# Patient Record
Sex: Female | Born: 1965 | Race: White | Hispanic: No | Marital: Single | Smoking: Current every day smoker
Health system: Southern US, Community
[De-identification: ages and names within clinical notes are randomized; demographics above are authoritative.]

## PROBLEM LIST (undated history)

## (undated) DIAGNOSIS — M1990 Unspecified osteoarthritis, unspecified site: Secondary | ICD-10-CM

## (undated) DIAGNOSIS — D649 Anemia, unspecified: Secondary | ICD-10-CM

## (undated) DIAGNOSIS — G43909 Migraine, unspecified, not intractable, without status migrainosus: Secondary | ICD-10-CM

## (undated) DIAGNOSIS — M549 Dorsalgia, unspecified: Secondary | ICD-10-CM

## (undated) DIAGNOSIS — Z8701 Personal history of pneumonia (recurrent): Secondary | ICD-10-CM

## (undated) HISTORY — DX: Migraine, unspecified, not intractable, without status migrainosus: G43.909

## (undated) HISTORY — DX: Personal history of pneumonia (recurrent): Z87.01

## (undated) HISTORY — PX: SHOULDER SURGERY: SHX246

## (undated) HISTORY — PX: KNEE SURGERY: SHX244

## (undated) HISTORY — DX: Unspecified osteoarthritis, unspecified site: M19.90

---

## 2014-08-03 ENCOUNTER — Encounter (HOSPITAL_COMMUNITY): Payer: Self-pay | Admitting: Emergency Medicine

## 2014-08-03 ENCOUNTER — Emergency Department (HOSPITAL_COMMUNITY): Payer: Self-pay

## 2014-08-03 DIAGNOSIS — R0789 Other chest pain: Secondary | ICD-10-CM | POA: Insufficient documentation

## 2014-08-03 DIAGNOSIS — R079 Chest pain, unspecified: Secondary | ICD-10-CM | POA: Insufficient documentation

## 2014-08-03 DIAGNOSIS — F17200 Nicotine dependence, unspecified, uncomplicated: Secondary | ICD-10-CM | POA: Insufficient documentation

## 2014-08-03 DIAGNOSIS — Z862 Personal history of diseases of the blood and blood-forming organs and certain disorders involving the immune mechanism: Secondary | ICD-10-CM | POA: Insufficient documentation

## 2014-08-03 DIAGNOSIS — Z79899 Other long term (current) drug therapy: Secondary | ICD-10-CM | POA: Insufficient documentation

## 2014-08-03 DIAGNOSIS — J159 Unspecified bacterial pneumonia: Secondary | ICD-10-CM | POA: Insufficient documentation

## 2014-08-03 LAB — I-STAT TROPONIN, ED: Troponin i, poc: 0 ng/mL (ref 0.00–0.08)

## 2014-08-03 NOTE — ED Notes (Signed)
Pt presents with Left side chest pain, non radiating, tremors, and nausea starting today 1400 today. Pt denies anything makes the pain better or worse. Pt states she was sitting at her desk at work when the pain started

## 2014-08-04 ENCOUNTER — Emergency Department (HOSPITAL_COMMUNITY)
Admission: EM | Admit: 2014-08-04 | Discharge: 2014-08-04 | Disposition: A | Discharge status: Home / Self Care | Payer: Self-pay | Attending: Emergency Medicine | Admitting: Emergency Medicine

## 2014-08-04 DIAGNOSIS — J189 Pneumonia, unspecified organism: Secondary | ICD-10-CM

## 2014-08-04 DIAGNOSIS — R0789 Other chest pain: Secondary | ICD-10-CM

## 2014-08-04 HISTORY — DX: Anemia, unspecified: D64.9

## 2014-08-04 HISTORY — DX: Dorsalgia, unspecified: M54.9

## 2014-08-04 LAB — BASIC METABOLIC PANEL
ANION GAP: 13 (ref 5–15)
BUN: 12 mg/dL (ref 6–23)
CALCIUM: 9.1 mg/dL (ref 8.4–10.5)
CO2: 23 mEq/L (ref 19–32)
Chloride: 103 mEq/L (ref 96–112)
Creatinine, Ser: 0.83 mg/dL (ref 0.50–1.10)
GFR calc non Af Amer: 83 mL/min — ABNORMAL LOW (ref >90)
Glucose, Bld: 104 mg/dL — ABNORMAL HIGH (ref 70–99)
Potassium: 4.6 mEq/L (ref 3.7–5.3)
SODIUM: 139 meq/L (ref 137–147)

## 2014-08-04 LAB — D-DIMER, QUANTITATIVE: D-Dimer, Quant: 0.27 ug/mL-FEU (ref 0.00–0.48)

## 2014-08-04 LAB — CBC
HCT: 38.6 % (ref 36.0–46.0)
Hemoglobin: 12.7 g/dL (ref 12.0–15.0)
MCH: 29.3 pg (ref 26.0–34.0)
MCHC: 32.9 g/dL (ref 30.0–36.0)
MCV: 89.1 fL (ref 78.0–100.0)
PLATELETS: 341 10*3/uL (ref 150–400)
RBC: 4.33 MIL/uL (ref 3.87–5.11)
RDW: 13.5 % (ref 11.5–15.5)
WBC: 14.8 10*3/uL — AB (ref 4.0–10.5)

## 2014-08-04 LAB — PRO B NATRIURETIC PEPTIDE: Pro B Natriuretic peptide (BNP): 90.8 pg/mL (ref 0–125)

## 2014-08-04 MED ORDER — LEVOFLOXACIN IN D5W 750 MG/150ML IV SOLN
750.0000 mg | Freq: Once | INTRAVENOUS | Status: AC
  Administered 2014-08-04: 750 mg via INTRAVENOUS
  Filled 2014-08-04: qty 150

## 2014-08-04 MED ORDER — BENZONATATE 100 MG PO CAPS: 100.0000 mg | ORAL_CAPSULE | Freq: Three times a day (TID) | ORAL | Status: DC

## 2014-08-04 MED ORDER — IBUPROFEN 600 MG PO TABS: 600.0000 mg | ORAL_TABLET | Freq: Four times a day (QID) | ORAL | Status: DC | PRN

## 2014-08-04 MED ORDER — LEVOFLOXACIN 750 MG PO TABS: 750.0000 mg | ORAL_TABLET | Freq: Every day | ORAL | Status: DC

## 2014-08-04 NOTE — ED Provider Notes (Signed)
CSN: [id number]     Arrival date & time 08/03/14  2244 History   First MD Initiated Contact with Patient 08/04/14 0016     Chief Complaint  Patient presents with  . Chest Pain     (Consider location/radiation/quality/duration/timing/severity/associated sxs/prior Treatment) HPI Patient presents with left-sided chest spasm starting today around 1400. The spasms last roughly 2-5 seconds. Is associated with shortness of breath. She's had no lower extremity swelling or pain. Patient with mild cough. She's had no fever chills. Patient had several episodes of spasms during the interview. She has an extensive family history of thromboembolic disease. No recent travel or surgeries. Patient smokes half pack to three quarters of a pack of cigarettes daily. Past Medical History  Diagnosis Date  . Anemia   . Back pain    Past Surgical History  Procedure Laterality Date  . Shoulder surgery     History reviewed. No pertinent family history. History  Substance Use Topics  . Smoking status: Current Every Day Smoker -- 0.50 packs/day    Types: Cigarettes  . Smokeless tobacco: Not on file  . Alcohol Use: Yes     Comment: social   OB History   Grav Para Term Preterm Abortions TAB SAB Ect Mult Living                 Review of Systems  Constitutional: Negative for fever and chills.  Respiratory: Positive for cough and shortness of breath.   Cardiovascular: Positive for chest pain. Negative for palpitations and leg swelling.  Gastrointestinal: Negative for nausea, vomiting, abdominal pain and diarrhea.  Musculoskeletal: Negative for back pain and neck pain.  Skin: Negative for rash and wound.  Neurological: Negative for dizziness, weakness, light-headedness and headaches.      Allergies  Review of patient's allergies indicates no known allergies.  Home Medications   Prior to Admission medications   Medication Sig Start Date End Date Taking? Authorizing Provider  acetaminophen (TYLENOL)  500 MG tablet Take 500-1,000 mg by mouth every 6 (six) hours as needed for moderate pain.   Yes Historical Provider, MD   BP 157/82  Pulse 76  Temp(Src) 97.9 F (36.6 C) (Oral)  Resp 16  Ht 5\' 3"  (1.6 m)  Wt 213 lb (96.616 kg)  BMI 37.74 kg/m2  SpO2 98%  LMP 07/01/2014 Physical Exam  ED Course  Procedures (including critical care time) Labs Review Labs Reviewed  CBC - Abnormal; Notable for the following:    WBC 14.8 (*)    All other components within normal limits  BASIC METABOLIC PANEL - Abnormal; Notable for the following:    Glucose, Bld 104 (*)    GFR calc non Af Amer 83 (*)    All other components within normal limits  D-DIMER, QUANTITATIVE  PRO B NATRIURETIC PEPTIDE  I-STAT TROPOININ, ED    Imaging Review Dg Chest 2 View  08/04/2014   CLINICAL DATA:  Chest pain and shortness of breath. History of smoking.  EXAM: CHEST  2 VIEW  COMPARISON:  None.  FINDINGS: The lungs are well expanded. Mild bibasilar airspace opacities may reflect mild pneumonia. No pleural effusion or pneumothorax is seen.  The heart is borderline normal in size. No acute osseous abnormalities are identified.  IMPRESSION: Mild bibasilar airspace opacities raise question for mild pneumonia.   Electronically Signed   By: Roanna Raider M.D.   On: 08/04/2014 00:37     EKG Interpretation   Date/Time:  Friday August 03 2014 22:54:05 EDT Ventricular  Rate:  72 PR Interval:  134 QRS Duration: 86 QT Interval:  420 QTC Calculation: 459 R Axis:   17 Text Interpretation:  Normal sinus rhythm Normal ECG Confirmed by  Alvia Tory  MD, Amala Petion ([id number]) on 08/04/2014 12:17:19 AM      MDM   Final diagnoses:  None    Patient with atypical chest pain for coronary artery disease. Troponin EKG without any abnormality. She has a normal d-dimer. Chest x-ray concerning for mild pneumoniain the setting of elevated white blood cells. Given first dose of antibiotic in emergency department and discharged home with  prescription. Return precautions have been given.    Loren Raceravid Etta Gassett, MD 08/04/14 [phone]

## 2014-08-04 NOTE — Discharge Instructions (Signed)

## 2014-09-29 NOTE — ED Provider Notes (Signed)
08/03/14  Physical Exam  Constitutional: She is oriented to person, place, and time. She appears well-developed and well-nourished. No distress.  HENT:  Head: Normocephalic and atraumatic.  Nose: Nose normal.  Mouth/Throat: Oropharynx is clear and moist. No oropharyngeal exudate.  Eyes: Conjunctivae and EOM are normal. Pupils are equal, round, and reactive to light.  Neck: Normal range of motion. Neck supple.  Cardiovascular: Normal rate, regular rhythm, normal heart sounds and intact distal pulses.  Exam reveals no gallop and no friction rub.   No murmur heard. Pulmonary/Chest: Effort normal. No respiratory distress. She has no wheezes.  Few scattered rhonchi  Abdominal: Soft. Bowel sounds are normal. She exhibits no distension and no mass. There is no tenderness. There is no rebound and no guarding. No hernia.  Musculoskeletal: Normal range of motion. She exhibits no edema and no tenderness.  Neurological: She is alert and oriented to person, place, and time.  Skin: Skin is warm and dry. No rash noted. She is not diaphoretic. No erythema. No pallor.  Psychiatric: She has a normal mood and affect. Her behavior is normal.      Loren Raceravid Deveron Shamoon, MD 09/29/14 [phone]

## 2014-11-27 ENCOUNTER — Ambulatory Visit (INDEPENDENT_AMBULATORY_CARE_PROVIDER_SITE_OTHER): Payer: BC Managed Care – PPO | Admitting: Internal Medicine

## 2014-11-27 ENCOUNTER — Encounter: Payer: Self-pay | Admitting: Internal Medicine

## 2014-11-27 VITALS — BP 134/86 | Temp 97.9°F | Ht 62.25 in | Wt 231.3 lb

## 2014-11-27 DIAGNOSIS — Z418 Encounter for other procedures for purposes other than remedying health state: Secondary | ICD-10-CM

## 2014-11-27 DIAGNOSIS — Z833 Family history of diabetes mellitus: Secondary | ICD-10-CM

## 2014-11-27 DIAGNOSIS — Z720 Tobacco use: Secondary | ICD-10-CM

## 2014-11-27 DIAGNOSIS — M5430 Sciatica, unspecified side: Secondary | ICD-10-CM | POA: Insufficient documentation

## 2014-11-27 DIAGNOSIS — R51 Headache: Secondary | ICD-10-CM

## 2014-11-27 DIAGNOSIS — R519 Headache, unspecified: Secondary | ICD-10-CM | POA: Insufficient documentation

## 2014-11-27 DIAGNOSIS — Z23 Encounter for immunization: Secondary | ICD-10-CM

## 2014-11-27 DIAGNOSIS — Z299 Encounter for prophylactic measures, unspecified: Secondary | ICD-10-CM

## 2014-11-27 DIAGNOSIS — Z8249 Family history of ischemic heart disease and other diseases of the circulatory system: Secondary | ICD-10-CM

## 2014-11-27 DIAGNOSIS — M5431 Sciatica, right side: Secondary | ICD-10-CM

## 2014-11-27 DIAGNOSIS — F17200 Nicotine dependence, unspecified, uncomplicated: Secondary | ICD-10-CM | POA: Insufficient documentation

## 2014-11-27 LAB — CBC WITH DIFFERENTIAL/PLATELET
Basophils Absolute: 0.1 10*3/uL (ref 0.0–0.1)
Basophils Relative: 0.6 % (ref 0.0–3.0)
EOS ABS: 0.2 10*3/uL (ref 0.0–0.7)
Eosinophils Relative: 1.8 % (ref 0.0–5.0)
HCT: 37.1 % (ref 36.0–46.0)
Hemoglobin: 12.3 g/dL (ref 12.0–15.0)
Lymphocytes Relative: 27.6 % (ref 12.0–46.0)
Lymphs Abs: 3.7 10*3/uL (ref 0.7–4.0)
MCHC: 33 g/dL (ref 30.0–36.0)
MCV: 85.7 fl (ref 78.0–100.0)
MONO ABS: 0.7 10*3/uL (ref 0.1–1.0)
Monocytes Relative: 5.1 % (ref 3.0–12.0)
NEUTROS PCT: 64.9 % (ref 43.0–77.0)
Neutro Abs: 8.6 10*3/uL — ABNORMAL HIGH (ref 1.4–7.7)
Platelets: 326 10*3/uL (ref 150.0–400.0)
RBC: 4.33 Mil/uL (ref 3.87–5.11)
RDW: 13.7 % (ref 11.5–15.5)
WBC: 13.2 10*3/uL — ABNORMAL HIGH (ref 4.0–10.5)

## 2014-11-27 LAB — TSH: TSH: 4.61 u[IU]/mL — ABNORMAL HIGH (ref 0.35–4.50)

## 2014-11-27 LAB — HEMOGLOBIN A1C: Hgb A1c MFr Bld: 6.1 % (ref 4.6–6.5)

## 2014-11-27 NOTE — Progress Notes (Signed)
Pre visit review using our clinic review tool, if applicable. No additional management support is needed unless otherwise documented below in the visit note.   Chief Complaint  Patient presents with  . Establish Care    HPI: Patient  Susan Peters  48 y.o. comes in today for new patient  Health Care visit . She needs a primary care physician. She is originally from [place] but been in the area  Since 1986 .Marland Kitchen  Went to [employer].   College  She's recently seen Dr Henderson Cloud  For gyne and  Nl pap  And planned to sonohysterogram to check for polyps of fibroids .   Sciatica  ongoing problem ever since she was younger Back issues since  Senior in HS .    ocass  Back ache    Has of flaring   Last year and both legs stopped working at that time  rx per chiropractor.    History of migraines takes medicines 3-4 times a month at the most does get frequent headaches. No sleep apnea. Has been immunized against varicella.  Health Maintenance  Topic Date Due  . INFLUENZA VACCINE  07/29/2015  . PAP SMEAR  11/15/2017  . TETANUS/TDAP  11/27/2024   Health Maintenance Review LIFESTYLE:  Exercise:   Walks a lot and lots of activity but no standard  Tobacco/ETS: less than pack per day . Onset  19  Pre 13   Stopped 4 years .   Alcohol: once in a blu moon  Sugar beverages: 3-4 per day  2-4 c coffee per day. And 3-4 sodas pepsi or md .    Sleep: 7-8  Drug use: no PAP: utd  MAMMO: 8 15   ROS:   chiro said had arthritsi back and hips  ses as needed  GEN/ HEENT: No fever, significant weight changes sweats headaches vision problems hearing changes, has migraines  fam hx of same  Some has .  ocass meds. Tylenol or advil aleve as needed .  3-4 x per month.  CV/ PULM; No chest pain shortness of breath cough, syncope,edema  change in exercise tolerance. GI /GU: No adominal pain, vomiting, change in bowel habits. No blood in the stool. No significant GU symptoms. History of proctitis blood in the stool when she was  much younger treated with change in diet avoiding red meat no recent recurrences. SKIN/HEME: ,no acute skin rashes suspicious lesions or bleeding. No lymphadenopathy, nodules, masses.  NEURO/ PSYCH:  No neurologic signs such as weakness numbness see back pain. No depression anxiety. IMM/ Allergy: No unusual infections.  Allergy .   REST of 12 system review negative except as per HPI   Past Medical History  Diagnosis Date  . Anemia   . Back pain   . Migraine     long standing 3-4 x per month takes med  . Arthritis   . Hx of bacterial pneumonia     Past Surgical History  Procedure Laterality Date  . Shoulder surgery Left     1981  . Knee surgery Left     1982    Family History  Problem Relation Age of Onset  . Arthritis Mother   . Cancer Mother     Uterus  . Hyperlipidemia Mother   . Stroke Mother   . Heart disease Mother   . Hypertension Mother   . Diabetes Mother   . Hyperlipidemia Father   . Heart disease Father   . Hypertension Father   . Early  death Father   . Cancer Maternal Aunt   . Hypertension Maternal Aunt   . Diabetes Maternal Aunt   . Heart disease Paternal Uncle   . Arthritis Maternal Grandmother   . Stroke Maternal Grandmother   . Hypertension Maternal Grandmother   . Arthritis Maternal Grandfather   . Heart disease Maternal Grandfather   . Hypertension Maternal Grandfather   . Arthritis Paternal Grandmother   . Heart disease Paternal Grandmother   . Hypertension Paternal Grandmother   . Diabetes Paternal Grandmother   . Arthritis Paternal Grandfather   . Heart disease Paternal Grandfather   . Stroke Paternal Grandfather   . Hypertension Paternal Grandfather   . Kidney disease Paternal Grandfather   . Cancer Maternal Aunt     Lung  . Diabetes Maternal Aunt   . Diabetes Cousin   maternal  Side diabetes  Moms and aunts  Sister is hypoglycemia  Dad mi  6745   Intrinsic heart disease  /cardiomyopathy .  History   Social History  . Marital  Status: Single    Spouse Name: N/A    Number of Children: N/A  . Years of Education: N/A   Social History Main Topics  . Smoking status: Current Every Day Smoker -- 0.50 packs/day    Types: Cigarettes  . Smokeless tobacco: Never Used  . Alcohol Use: 0.0 oz/week    0 Not specified per week     Comment: social  . Drug Use: No  . Sexual Activity: None   Other Topics Concern  . 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 [DISCONTINUED] acetaminophen (TYLENOL) 500 MG tablet Take 500-1,000 mg by mouth every 6 (six) hours as needed for moderate pain.  . [DISCONTINUED] benzonatate (TESSALON) 100 MG capsule Take 1 capsule (100 mg total) by mouth every 8 (eight) hours.  . [DISCONTINUED] ibuprofen (ADVIL,MOTRIN) 600 MG tablet Take 1 tablet (600 mg total) by mouth every 6 (six) hours as needed.  . [DISCONTINUED] levofloxacin (LEVAQUIN) 750 MG tablet Take 1 tablet (750 mg total) by mouth daily. X 7 days    EXAM:  BP 134/86 mmHg  Temp(Src) 97.9 F (36.6 C) (Oral)  Ht 5' 2.25" (1.581 m)  Wt 231 lb 4.8 oz (104.917 kg)  BMI 41.97 kg/m2  LMP 11/25/2014  Body mass index is 41.97 kg/(m^2).  Physical Exam: Vital signs reviewed UUV:[id number]:This is a well-developed well-nourished alert cooperative    who appearsr stated age in no acute distress. Walks someone limping  HEENT: normocephalic atraumatic , Eyes: PERRL EOM's full, conjunctiva clear, Nares: paten,t no deformity discharge or tenderness., Ears: no deformity EAC's clear TMs with normal landmarks. Mouth: clear OP, no lesions, edema.  Moist mucous membranes. Dentition in adequate repair. NECK: supple without masses, thyromegaly or  bruits. CHEST/PULM:  Clear to auscultation and percussion breath sounds equal no wheeze , rales or rhonchi.  CV: PMI is nondisplaced, S1 S2 no gallops, murmurs, rubs. Peripheral pulses are full without delay.No JVD .  ABDOMEN: Bowel sounds normal nontender  No guard or rebound, no hepato splenomegal no CVA tenderness.  Extremtities:  No clubbing cyanosis or edema, no acute joint swelling or redness no focal atrophy NEURO:  Oriented x3, cranial nerves 3-12 appear to be intact, no obvious focal weakness,gait  Slightly  antalgic  SKIN: No acute rashes normal turgor, color, no bruising or petechiae. PSYCH: Oriented, good eye contact, no obvious depression anxiety, cognition and judgment appear normal. LN: no cervical l adenopathy    ASSESSMENT AND PLAN:  Discussed the following assessment and plan:  Sciatica, right - managing sees chiro  Frequent headaches - lsi tracking  - Plan: Basic metabolic panel, CBC with Differential, Hepatic function panel, Lipid panel, TSH, Hemoglobin A1c, CANCELED: POCT urinalysis dipstick  Family history of heart disease - Plan: Basic metabolic panel, CBC with Differential, Hepatic function panel, Lipid panel, TSH, Hemoglobin A1c, CANCELED: POCT urinalysis dipstick  Tobacco use disorder - encouraged to stop  rx if needed   Family history of diabetes mellitus (DM) - dsic change diet and beverages  labs today - Plan: Basic metabolic panel, CBC with Differential, Hepatic function panel, Lipid panel, TSH, Hemoglobin A1c, CANCELED: POCT urinalysis dipstick  Preventive measure - Plan: Basic metabolic panel, CBC with Differential, Hepatic function panel, Lipid panel, TSH, Hemoglobin A1c, CANCELED: POCT urinalysis dipstick  Need for prophylactic vaccination and inoculation against influenza - Plan: Flu Vaccine QUAD 36+ mos PF IM (Fluarix Quad PF)  Need for Tdap vaccination - Plan: Tdap vaccine greater than or equal to 7yo IM  Patient Care Team: Madelin HeadingsWanda K Panosh, MD as  PCP - General (Internal Medicine) Leslie AndreaJames E Tomblin II, MD as Consulting Physician (Obstetrics and Gynecology) Patient Instructions   Decrease limit any sugar beverages  To avoid getting diabetes   You have a strong family hx .   Stopping tobacco is the best heart healthy prevention you can do .   Will notify you  of labs when available.  And then plan follow up  Advise tracking  Calories beverages and exercise and sleep . Some weight loss may help your back and avoid diabetes .  Can  Do a nurtition referral if appropriate    Healthy lifestyle includes : At least 150 minutes of exercise weeks  , weight at healthy levels, which is usually   BMI 19-25. Avoid trans fats and processed foods;  Increase fresh fruits and veges to 5 servings per day. And avoid sweet beverages including tea and juice. Mediterranean diet with olive oil and nuts have been noted to be heart and brain healthy . Avoid tobacco products . Limit  alcohol to  7 per week for women and 14 servings for men.  Get adequate sleep . Wear seat belts . Don't text and drive .      Why follow it? Research shows. . Those who follow the Mediterranean diet have a reduced risk of heart disease  . The diet is associated with a reduced incidence of Parkinson's and Alzheimer's diseases . People following the diet may have longer life expectancies and lower rates of chronic diseases  . The Dietary Guidelines for Americans recommends the Mediterranean diet as an eating plan to promote health and prevent disease  What Is the Mediterranean Diet?  . Healthy eating plan based on typical foods and recipes of Mediterranean-style cooking . The diet is primarily a plant based diet; these foods should make up a majority of meals   Starches - Plant based foods should make up a majority of meals - They are an important sources of vitamins, minerals, energy, antioxidants, and  fiber - Choose whole grains, foods high in fiber and minimally processed  items  - Typical grain sources include wheat, oats, barley, corn, brown rice, bulgar, farro, millet, polenta, couscous  - Various types of beans include chickpeas, lentils, fava beans, black beans, white beans   Fruits  Veggies - Large quantities of antioxidant rich fruits & veggies; 6 or more servings  - Vegetables can be eaten raw or lightly drizzled with oil and cooked  - Vegetables common to the traditional Mediterranean Diet include: artichokes, arugula, beets, broccoli, brussel sprouts, cabbage, carrots, celery, collard greens, cucumbers, eggplant, kale, leeks, lemons, lettuce, mushrooms, okra, onions, peas, peppers, potatoes, pumpkin, radishes, rutabaga, shallots, spinach, sweet potatoes, turnips, zucchini - Fruits common to the Mediterranean Diet include: apples, apricots, avocados, cherries, clementines, dates, figs, grapefruits, grapes, melons, nectarines, oranges, peaches, pears, pomegranates, strawberries, tangerines  Fats - Replace butter and margarine with healthy oils, such as olive oil, canola oil, and tahini  - Limit nuts to no more than a handful a day  - Nuts include walnuts, almonds, pecans, pistachios, pine nuts  - Limit or avoid candied, honey roasted or heavily salted nuts - Olives are central to the [employer] - can be eaten whole or used in a variety of dishes   Meats Protein - Limiting red meat: no more than a few times a month - When eating red meat: choose lean cuts and keep the portion to the size of deck of cards - Eggs: approx. 0 to 4 times a week  - Fish and lean poultry: at least 2 a week  - Healthy protein sources include, chicken, [place], lean beef, lamb - Increase intake of seafood such as tuna, salmon, trout, mackerel, shrimp, scallops - Avoid or limit high fat processed meats such as sausage and bacon  Dairy - Include moderate amounts of low fat dairy products  - Focus on healthy dairy such as fat free yogurt, skim milk, low or reduced fat cheese -  Limit dairy products higher in fat such as whole or 2% milk, cheese, ice cream  Alcohol - Moderate amounts of red wine is ok  - No more than 5 oz daily for women (all ages) and men older than age 28  - No more than 10 oz of wine daily for men younger than 67  Other - Limit sweets and other desserts  - Use herbs and spices instead of salt to flavor foods  - Herbs and spices common to the traditional Mediterranean Diet include: basil, bay leaves, chives, cloves, cumin, fennel, garlic, lavender, marjoram, mint, oregano, parsley, pepper, rosemary, sage, savory, sumac, tarragon, thyme   It's not just a diet, it's a lifestyle:  . The Mediterranean diet includes lifestyle factors typical of those in the region  . Foods, drinks and meals are best eaten with others and savored . Daily physical activity is important for overall good health . This could be strenuous exercise like running and aerobics . This could also be more leisurely activities such as walking, housework, yard-work, or taking the stairs . Moderation is the key; a balanced and healthy diet accommodates most foods and drinks . Consider portion sizes and frequency of consumption of certain foods   Meal Ideas & Options:  . Breakfast:  o Whole wheat toast or whole wheat English muffins with peanut butter & hard boiled egg o Steel cut oats topped with apples & cinnamon and skim milk  o Fresh fruit: banana, strawberries, melon, berries, peaches  o Smoothies:  strawberries, bananas, greek yogurt, peanut butter o Low fat greek yogurt with blueberries and granola  o Egg white omelet with spinach and mushrooms o Breakfast couscous: whole wheat couscous, apricots, skim milk, cranberries  . Sandwiches:  o Hummus and grilled vegetables (peppers, zucchini, squash) on whole wheat bread   o Grilled chicken on whole wheat pita with lettuce, tomatoes, cucumbers or tzatziki  o Tuna salad on whole wheat bread: tuna salad made with greek yogurt,  olives, red peppers, capers, green onions o Garlic rosemary lamb pita: lamb sauted with garlic, rosemary, salt & pepper; add lettuce, cucumber, greek yogurt to pita - flavor with lemon juice and black pepper  . Seafood:  o Mediterranean grilled salmon, seasoned with garlic, basil, parsley, lemon juice and black pepper o Shrimp, lemon, and spinach whole-grain pasta salad made with low fat greek yogurt  o Seared scallops with lemon orzo  o Seared tuna steaks seasoned salt, pepper, coriander topped with tomato mixture of olives, tomatoes, olive oil, minced garlic, parsley, green onions and cappers  . Meats:  o Herbed greek chicken salad with kalamata olives, cucumber, feta  o Red bell peppers stuffed with spinach, bulgur, lean ground beef (or lentils) & topped with feta   o Kebabs: skewers of chicken, tomatoes, onions, zucchini, squash  o [place] burgers: made with red onions, mint, dill, lemon juice, feta cheese topped with roasted red peppers . Vegetarian o Cucumber salad: cucumbers, artichoke hearts, celery, red onion, feta cheese, tossed in olive oil & lemon juice  o Hummus and whole grain pita points with a greek salad (lettuce, tomato, feta, olives, cucumbers, red onion) o Lentil soup with celery, carrots made with vegetable broth, garlic, salt and pepper  o Tabouli salad: parsley, bulgur, mint, scallions, cucumbers, tomato, radishes, lemon juice, olive oil, salt and pepper.           Neta Mends. Panosh M.D.

## 2014-11-27 NOTE — Patient Instructions (Addendum)
Decrease limit any sugar beverages  To avoid getting diabetes   You have a strong family hx .   Stopping tobacco is the best heart healthy prevention you can do .   Will notify you  of labs when available.  And then plan follow up  Advise tracking  Calories beverages and exercise and sleep . Some weight loss may help your back and avoid diabetes .  Can  Do a nurtition referral if appropriate    Healthy lifestyle includes : At least 150 minutes of exercise weeks  , weight at healthy levels, which is usually   BMI 19-25. Avoid trans fats and processed foods;  Increase fresh fruits and veges to 5 servings per day. And avoid sweet beverages including tea and juice. Mediterranean diet with olive oil and nuts have been noted to be heart and brain healthy . Avoid tobacco products . Limit  alcohol to  7 per week for women and 14 servings for men.  Get adequate sleep . Wear seat belts . Don't text and drive .      Why follow it? Research shows. . Those who follow the Mediterranean diet have a reduced risk of heart disease  . The diet is associated with a reduced incidence of Parkinson's and Alzheimer's diseases . People following the diet may have longer life expectancies and lower rates of chronic diseases  . The Dietary Guidelines for Americans recommends the Mediterranean diet as an eating plan to promote health and prevent disease  What Is the Mediterranean Diet?  . Healthy eating plan based on typical foods and recipes of Mediterranean-style cooking . The diet is primarily a plant based diet; these foods should make up a majority of meals   Starches - Plant based foods should make up a majority of meals - They are an important sources of vitamins, minerals, energy, antioxidants, and fiber - Choose whole grains, foods high in fiber and minimally processed items  - Typical grain sources include wheat, oats, barley, corn, brown rice, bulgar, farro, millet, polenta, couscous  - Various  types of beans include chickpeas, lentils, fava beans, black beans, white beans   Fruits  Veggies - Large quantities of antioxidant rich fruits & veggies; 6 or more servings  - Vegetables can be eaten raw or lightly drizzled with oil and cooked  - Vegetables common to the traditional Mediterranean Diet include: artichokes, arugula, beets, broccoli, brussel sprouts, cabbage, carrots, celery, collard greens, cucumbers, eggplant, kale, leeks, lemons, lettuce, mushrooms, okra, onions, peas, peppers, potatoes, pumpkin, radishes, rutabaga, shallots, spinach, sweet potatoes, turnips, zucchini - Fruits common to the Mediterranean Diet include: apples, apricots, avocados, cherries, clementines, dates, figs, grapefruits, grapes, melons, nectarines, oranges, peaches, pears, pomegranates, strawberries, tangerines  Fats - Replace butter and margarine with healthy oils, such as olive oil, canola oil, and tahini  - Limit nuts to no more than a handful a day  - Nuts include walnuts, almonds, pecans, pistachios, pine nuts  - Limit or avoid candied, honey roasted or heavily salted nuts - Olives are central to the [employer] diet - can be eaten whole or used in a variety of dishes   Meats Protein - Limiting red meat: no more than a few times a month - When eating red meat: choose lean cuts and keep the portion to the size of deck of cards - Eggs: approx. 0 to 4 times a week  - Fish and lean poultry: at least 2 a week  - Healthy protein sources include,  chicken, [place], lean beef, lamb - Increase intake of seafood such as tuna, salmon, trout, mackerel, shrimp, scallops - Avoid or limit high fat processed meats such as sausage and bacon  Dairy - Include moderate amounts of low fat dairy products  - Focus on healthy dairy such as fat free yogurt, skim milk, low or reduced fat cheese - Limit dairy products higher in fat such as whole or 2% milk, cheese, ice cream  Alcohol - Moderate amounts of red wine is ok  - No  more than 5 oz daily for women (all ages) and men older than age 34  - No more than 10 oz of wine daily for men younger than 59  Other - Limit sweets and other desserts  - Use herbs and spices instead of salt to flavor foods  - Herbs and spices common to the traditional Mediterranean Diet include: basil, bay leaves, chives, cloves, cumin, fennel, garlic, lavender, marjoram, mint, oregano, parsley, pepper, rosemary, sage, savory, sumac, tarragon, thyme   It's not just a diet, it's a lifestyle:  . The Mediterranean diet includes lifestyle factors typical of those in the region  . Foods, drinks and meals are best eaten with others and savored . Daily physical activity is important for overall good health . This could be strenuous exercise like running and aerobics . This could also be more leisurely activities such as walking, housework, yard-work, or taking the stairs . Moderation is the key; a balanced and healthy diet accommodates most foods and drinks . Consider portion sizes and frequency of consumption of certain foods   Meal Ideas & Options:  . Breakfast:  o Whole wheat toast or whole wheat English muffins with peanut butter & hard boiled egg o Steel cut oats topped with apples & cinnamon and skim milk  o Fresh fruit: banana, strawberries, melon, berries, peaches  o Smoothies: strawberries, bananas, greek yogurt, peanut butter o Low fat greek yogurt with blueberries and granola  o Egg white omelet with spinach and mushrooms o Breakfast couscous: whole wheat couscous, apricots, skim milk, cranberries  . Sandwiches:  o Hummus and grilled vegetables (peppers, zucchini, squash) on whole wheat bread   o Grilled chicken on whole wheat pita with lettuce, tomatoes, cucumbers or tzatziki  o Tuna salad on whole wheat bread: tuna salad made with greek yogurt, olives, red peppers, capers, green onions o Garlic rosemary lamb pita: lamb sauted with garlic, rosemary, salt & pepper; add lettuce,  cucumber, greek yogurt to pita - flavor with lemon juice and black pepper  . Seafood:  o Mediterranean grilled salmon, seasoned with garlic, basil, parsley, lemon juice and black pepper o Shrimp, lemon, and spinach whole-grain pasta salad made with low fat greek yogurt  o Seared scallops with lemon orzo  o Seared tuna steaks seasoned salt, pepper, coriander topped with tomato mixture of olives, tomatoes, olive oil, minced garlic, parsley, green onions and cappers  . Meats:  o Herbed greek chicken salad with kalamata olives, cucumber, feta  o Red bell peppers stuffed with spinach, bulgur, lean ground beef (or lentils) & topped with feta   o Kebabs: skewers of chicken, tomatoes, onions, zucchini, squash  o [place] burgers: made with red onions, mint, dill, lemon juice, feta cheese topped with roasted red peppers . Vegetarian o Cucumber salad: cucumbers, artichoke hearts, celery, red onion, feta cheese, tossed in olive oil & lemon juice  o Hummus and whole grain pita points with a greek salad (lettuce, tomato, feta, olives, cucumbers, red  onion) o Lentil soup with celery, carrots made with vegetable broth, garlic, salt and pepper  o Tabouli salad: parsley, bulgur, mint, scallions, cucumbers, tomato, radishes, lemon juice, olive oil, salt and pepper.

## 2014-11-28 LAB — BASIC METABOLIC PANEL
BUN: 11 mg/dL (ref 6–23)
CALCIUM: 8.7 mg/dL (ref 8.4–10.5)
CO2: 22 meq/L (ref 19–32)
CREATININE: 0.6 mg/dL (ref 0.4–1.2)
Chloride: 107 mEq/L (ref 96–112)
GFR: 115.65 mL/min (ref >60.00)
Glucose, Bld: 74 mg/dL (ref 70–99)
Potassium: 3.6 mEq/L (ref 3.5–5.1)
Sodium: 136 mEq/L (ref 135–145)

## 2014-11-28 LAB — HEPATIC FUNCTION PANEL
ALK PHOS: 116 U/L (ref 39–117)
ALT: 11 U/L (ref 0–35)
AST: 16 U/L (ref 0–37)
Albumin: 3.9 g/dL (ref 3.5–5.2)
BILIRUBIN TOTAL: 0.4 mg/dL (ref 0.2–1.2)
Bilirubin, Direct: 0 mg/dL (ref 0.0–0.3)
Total Protein: 7.1 g/dL (ref 6.0–8.3)

## 2014-11-29 LAB — LIPID PANEL
Cholesterol: 191 mg/dL (ref 0–200)
HDL: 28.5 mg/dL — AB (ref >39.00)
LDL Cholesterol: 138 mg/dL — ABNORMAL HIGH (ref 0–99)
NonHDL: 162.5
TRIGLYCERIDES: 125 mg/dL (ref 0.0–149.0)
Total CHOL/HDL Ratio: 7
VLDL: 25 mg/dL (ref 0.0–40.0)

## 2014-12-03 ENCOUNTER — Encounter: Payer: Self-pay | Admitting: Internal Medicine

## 2014-12-04 ENCOUNTER — Telehealth: Payer: Self-pay | Admitting: Family Medicine

## 2014-12-04 NOTE — Telephone Encounter (Signed)
When should pt come for repeat labs? Need a dx code for poct urine.  TSH, T4 Free, Thyroid antibodies - Abnormal TSH

## 2014-12-07 ENCOUNTER — Other Ambulatory Visit: Payer: Self-pay | Admitting: Family Medicine

## 2014-12-07 DIAGNOSIS — M549 Dorsalgia, unspecified: Secondary | ICD-10-CM

## 2014-12-07 DIAGNOSIS — R7989 Other specified abnormal findings of blood chemistry: Secondary | ICD-10-CM

## 2014-12-07 NOTE — Telephone Encounter (Signed)
Use back pain  .  Check labs   In early January

## 2014-12-07 NOTE — Telephone Encounter (Signed)
Orders placed in the system and pt made an appt for 01/04/15

## 2015-01-04 ENCOUNTER — Other Ambulatory Visit (INDEPENDENT_AMBULATORY_CARE_PROVIDER_SITE_OTHER): Payer: BLUE CROSS/BLUE SHIELD

## 2015-01-04 DIAGNOSIS — M549 Dorsalgia, unspecified: Secondary | ICD-10-CM

## 2015-01-04 DIAGNOSIS — R7989 Other specified abnormal findings of blood chemistry: Secondary | ICD-10-CM

## 2015-01-04 LAB — TSH: TSH: 3.7 u[IU]/mL (ref 0.35–4.50)

## 2015-01-04 LAB — POCT URINALYSIS DIPSTICK
BILIRUBIN UA: NEGATIVE
GLUCOSE UA: NEGATIVE
KETONES UA: NEGATIVE
NITRITE UA: NEGATIVE
Protein, UA: NEGATIVE
SPEC GRAV UA: 1.025
Urobilinogen, UA: 0.2
pH, UA: 5.5

## 2015-01-04 LAB — T4, FREE: Free T4: 0.76 ng/dL (ref 0.60–1.60)

## 2015-01-05 LAB — THYROID ANTIBODIES
THYROID PEROXIDASE ANTIBODY: 1 [IU]/mL (ref <9)
Thyroglobulin Ab: <1 IU/mL (ref <2)

## 2015-01-14 ENCOUNTER — Encounter: Payer: Self-pay | Admitting: Internal Medicine

## 2015-01-28 HISTORY — PX: ABDOMINAL HYSTERECTOMY: SHX81

## 2015-06-03 ENCOUNTER — Other Ambulatory Visit (INDEPENDENT_AMBULATORY_CARE_PROVIDER_SITE_OTHER): Payer: BLUE CROSS/BLUE SHIELD

## 2015-06-03 DIAGNOSIS — Z0000 Encounter for general adult medical examination without abnormal findings: Secondary | ICD-10-CM

## 2015-06-03 LAB — LIPID PANEL
Cholesterol: 199 mg/dL (ref 0–200)
HDL: 36.6 mg/dL — ABNORMAL LOW (ref >39.00)
LDL Cholesterol: 143 mg/dL — ABNORMAL HIGH (ref 0–99)
NonHDL: 162.4
Total CHOL/HDL Ratio: 5
Triglycerides: 97 mg/dL (ref 0.0–149.0)
VLDL: 19.4 mg/dL (ref 0.0–40.0)

## 2015-06-03 LAB — BASIC METABOLIC PANEL
BUN: 19 mg/dL (ref 6–23)
CALCIUM: 9.3 mg/dL (ref 8.4–10.5)
CHLORIDE: 106 meq/L (ref 96–112)
CO2: 24 mEq/L (ref 19–32)
CREATININE: 0.82 mg/dL (ref 0.40–1.20)
GFR: 78.93 mL/min (ref >60.00)
Glucose, Bld: 104 mg/dL — ABNORMAL HIGH (ref 70–99)
Potassium: 3.9 mEq/L (ref 3.5–5.1)
Sodium: 136 mEq/L (ref 135–145)

## 2015-06-03 LAB — CBC WITH DIFFERENTIAL/PLATELET
BASOS PCT: 0.6 % (ref 0.0–3.0)
Basophils Absolute: 0.1 10*3/uL (ref 0.0–0.1)
Eosinophils Absolute: 0.3 10*3/uL (ref 0.0–0.7)
Eosinophils Relative: 2.5 % (ref 0.0–5.0)
HCT: 39.4 % (ref 36.0–46.0)
HEMOGLOBIN: 13 g/dL (ref 12.0–15.0)
LYMPHS ABS: 4 10*3/uL (ref 0.7–4.0)
LYMPHS PCT: 30.9 % (ref 12.0–46.0)
MCHC: 32.9 g/dL (ref 30.0–36.0)
MCV: 85.6 fl (ref 78.0–100.0)
MONO ABS: 0.7 10*3/uL (ref 0.1–1.0)
Monocytes Relative: 5.7 % (ref 3.0–12.0)
NEUTROS PCT: 60.3 % (ref 43.0–77.0)
Neutro Abs: 7.9 10*3/uL — ABNORMAL HIGH (ref 1.4–7.7)
Platelets: 310 10*3/uL (ref 150.0–400.0)
RBC: 4.61 Mil/uL (ref 3.87–5.11)
RDW: 13.9 % (ref 11.5–15.5)
WBC: 13.1 10*3/uL — ABNORMAL HIGH (ref 4.0–10.5)

## 2015-06-03 LAB — TSH: TSH: 3.64 u[IU]/mL (ref 0.35–4.50)

## 2015-06-03 LAB — HEPATIC FUNCTION PANEL
ALT: 17 U/L (ref 0–35)
AST: 14 U/L (ref 0–37)
Albumin: 4 g/dL (ref 3.5–5.2)
Alkaline Phosphatase: 156 U/L — ABNORMAL HIGH (ref 39–117)
BILIRUBIN DIRECT: 0.1 mg/dL (ref 0.0–0.3)
TOTAL PROTEIN: 7.2 g/dL (ref 6.0–8.3)
Total Bilirubin: 0.2 mg/dL (ref 0.2–1.2)

## 2015-06-10 ENCOUNTER — Ambulatory Visit (INDEPENDENT_AMBULATORY_CARE_PROVIDER_SITE_OTHER): Payer: BLUE CROSS/BLUE SHIELD | Admitting: Internal Medicine

## 2015-06-10 ENCOUNTER — Encounter: Payer: Self-pay | Admitting: Internal Medicine

## 2015-06-10 VITALS — BP 130/70 | Temp 97.8°F | Ht 62.5 in | Wt 231.7 lb

## 2015-06-10 DIAGNOSIS — E785 Hyperlipidemia, unspecified: Secondary | ICD-10-CM

## 2015-06-10 DIAGNOSIS — N958 Other specified menopausal and perimenopausal disorders: Secondary | ICD-10-CM

## 2015-06-10 DIAGNOSIS — Z0000 Encounter for general adult medical examination without abnormal findings: Secondary | ICD-10-CM

## 2015-06-10 DIAGNOSIS — R200 Anesthesia of skin: Secondary | ICD-10-CM

## 2015-06-10 DIAGNOSIS — R7301 Impaired fasting glucose: Secondary | ICD-10-CM | POA: Diagnosis not present

## 2015-06-10 DIAGNOSIS — Z720 Tobacco use: Secondary | ICD-10-CM

## 2015-06-10 DIAGNOSIS — R748 Abnormal levels of other serum enzymes: Secondary | ICD-10-CM | POA: Diagnosis not present

## 2015-06-10 DIAGNOSIS — E8940 Asymptomatic postprocedural ovarian failure: Secondary | ICD-10-CM

## 2015-06-10 DIAGNOSIS — F17200 Nicotine dependence, unspecified, uncomplicated: Secondary | ICD-10-CM

## 2015-06-10 NOTE — Progress Notes (Signed)
Pre visit review using our clinic review tool, if applicable. No additional management support is needed unless otherwise documented below in the visit note.  Chief Complaint  Patient presents with  . Annual Exam    HPI: Patient  Susan Peters  49 y.o. comes in today for Preventive Health Care visit  Still tobacco  Had hyst in feb and bleeding other better minimal menopaussal sx  No reg meds  Since then  Had lost some weight and back  On it .  No injury  Right thigh still numb lagteral area no injury bac stable  Health Maintenance  Topic Date Due  . HIV Screening  05/28/2016 (Originally 12/12/1981)  . INFLUENZA VACCINE  07/29/2015  . PAP SMEAR  11/15/2017  . TETANUS/TDAP  11/27/2024   Health Maintenance Review LIFESTYLE:  Exercise:  Works 40 hours a week  Tobacco/ETS:1/2 ppd +  Alcohol: per day none to limited  Sugar beverages: Sleep: 7-8  Hours      Drug use: no  PAP: had hysterectomy some hot flushes   Dr Henderson Cloud   ROS:  GEN/ HEENT: No fever, significant weight changes sweats headaches vision problems hearing changes, CV/ PULM; No chest pain shortness of breath cough, syncope,edema  change in exercise tolerance. GI /GU: No adominal pain, vomiting, change in bowel habits. No blood in the stool. No significant GU symptoms. SKIN/HEME: ,no acute skin rashes suspicious lesions or bleeding. No lymphadenopathy, nodules, masses.  NEURO/ PSYCH:  No neurologic signs such as weakness numbness. No depression anxiety. IMM/ Allergy: No unusual infections.  Allergy .   REST of 12 system review negative except as per HPI   Past Medical History  Diagnosis Date  . Anemia   . Back pain   . Migraine     long standing 3-4 x per month takes med  . Arthritis   . Hx of bacterial pneumonia     Past Surgical History  Procedure Laterality Date  . Shoulder surgery Left     1981  . Knee surgery Left     1982    Family History  Problem Relation Age of Onset  . Arthritis Mother   .  Cancer Mother     Uterus  . Hyperlipidemia Mother   . Stroke Mother   . Heart disease Mother   . Hypertension Mother   . Diabetes Mother   . Hyperlipidemia Father   . Heart disease Father   . Hypertension Father   . Early death Father   . Cancer Maternal Aunt   . Hypertension Maternal Aunt   . Diabetes Maternal Aunt   . Heart disease Paternal Uncle   . Arthritis Maternal Grandmother   . Stroke Maternal Grandmother   . Hypertension Maternal Grandmother   . Arthritis Maternal Grandfather   . Heart disease Maternal Grandfather   . Hypertension Maternal Grandfather   . Arthritis Paternal Grandmother   . Heart disease Paternal Grandmother   . Hypertension Paternal Grandmother   . Diabetes Paternal Grandmother   . Arthritis Paternal Grandfather   . Heart disease Paternal Grandfather   . Stroke Paternal Grandfather   . Hypertension Paternal Grandfather   . Kidney disease Paternal Grandfather   . Cancer Maternal Aunt     Lung  . Diabetes Maternal Aunt   . Diabetes Cousin     History   Social History  . Marital Status: Single    Spouse Name: N/A  . Number of Children: N/A  . Years of Education:  N/A   Social History Main Topics  . Smoking status: Current Every Day Smoker -- 0.50 packs/day    Types: Cigarettes  . Smokeless tobacco: Never Used  . Alcohol Use: 0.0 oz/week    0 Standard drinks or equivalent per week     Comment: social  . Drug Use: No  . Sexual Activity: Not on file   Other Topics Concern  . 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   No facility-administered medications prior to visit.     EXAM:  BP 130/70 mmHg   Temp(Src) 97.8 F (36.6 C) (Oral)  Ht 5' 2.5" (1.588 m)  Wt 231 lb 11.2 oz (105.098 kg)  BMI 41.68 kg/m2  LMP 11/25/2014  Body mass index is 41.68 kg/(m^2).  Physical Exam: Vital signs reviewed NFA:OZHY is a well-developed well-nourished alert cooperative    who appearsr stated age in no acute distress.  HEENT: normocephalic atraumatic , Eyes: PERRL EOM's full, conjunctiva clear, Nares: paten,t no deformity discharge or tenderness., Ears: no deformity EAC's clear TMs with normal landmarks. Mouth: clear OP, no lesions, edema.  Moist mucous membranes. Dentition in adequate repair. NECK: supple without masses, thyromegaly or bruits. CHEST/PULM:  Clear to auscultation and percussion breath sounds equal no wheeze , rales or rhonchi. No chest wall deformities or tenderness.Breast: normal by inspection . No dimpling, discharge, masses, tenderness or discharge . CV: PMI is nondisplaced, S1 S2 no gallops, murmurs, rubs. Peripheral pulses are full without delay.No JVD .  ABDOMEN: Bowel sounds normal nontender  No guard or rebound, no hepato splenomegal no CVA tenderness.   Extremtities:  No clubbing cyanosis or edema, no acute joint swelling or redness no focal atrophy NEURO:  Oriented x3, cranial nerves 3-12 appear to be intact, no obvious focal weakness,gait within normal limits no abnormal reflexes or asymmetrical dec sens lateral right thigh not tested SKIN: No acute rashes normal turgor, color, no bruising or petechiae. PSYCH: Oriented, good eye contact, no obvious depression anxiety, cognition and judgment appear normal. LN: no cervical axillary inguinal adenopathy  Lab Results  Component Value Date   WBC 13.1* 06/03/2015   HGB 13.0 06/03/2015   HCT 39.4 06/03/2015   PLT 310.0 06/03/2015   GLUCOSE 104* 06/03/2015   CHOL 199 06/03/2015   TRIG 97.0 06/03/2015   HDL 36.60* 06/03/2015   LDLCALC 143* 06/03/2015   ALT 17 06/03/2015   AST 14 06/03/2015   NA 136 06/03/2015   K 3.9 06/03/2015    CL 106 06/03/2015   CREATININE 0.82 06/03/2015   BUN 19 06/03/2015   CO2 24 06/03/2015   TSH 3.64 06/03/2015   HGBA1C 6.1 11/27/2014   Wt Readings from Last 3 Encounters:  06/10/15 231 lb 11.2 oz (105.098 kg)  11/27/14 231 lb 4.8 oz (104.917 kg)  08/03/14 213 lb (96.616 kg)   BP Readings from Last 3 Encounters:  06/10/15 130/70  11/27/14 134/86  08/04/14 104/59    ASSESSMENT AND PLAN:  Discussed the following assessment and plan:  Visit for preventive health examination -  had nov 15   Tobacco use disorder - advised dc  Elevated alkaline phosphatase level - had surgery in february total hystectomy - Plan: Alkaline phosphatase, Gamma GT, Nucleotidase, 5', blood  Dyslipidemia - dsic low hdl impr but stop tobacco etc   Severe obesity (BMI >= 40)  Fasting hyperglycemia - lsi check a1c next labs - Plan: Hemoglobin A1c  Surgical menopause - doing ok.   Thigh numbness - poss form sciatica peripheral from  compression  fu neuro if progressive  no weakness  Patient Care Team: Madelin Headings, MD as PCP - General (Internal Medicine) Harold Hedge, MD as Consulting Physician (Obstetrics and Gynecology) Patient Instructions  Take blood pressure readings twice a day for 7- 10 days and then periodically .To ensure below 140/90   .Send in readings     On e lab tests is abnormal   We should repeat this     Could be from liver from bone    Issues  Not alarming but  Needs fu repeat .  Healthy lifestyle includes : At least 150 minutes of exercise weeks  , weight at healthy levels, which is usually   BMI 19-25. Avoid trans fats and processed foods;  Increase fresh fruits and veges to 5 servings per day. And avoid sweet beverages including tea and juice. Mediterranean diet with olive oil and nuts have been noted to be heart and brain healthy . Avoid tobacco products . Limit  alcohol to  7 per week for women and 14 servings for men.  Get adequate sleep . Wear seat belts . Don't text  and drive .   If the numbness right thigh is progressive contact for possible neurology evaluation...       Neta Mends. Shanikqua Zarzycki M.D.

## 2015-06-10 NOTE — Patient Instructions (Signed)
Take blood pressure readings twice a day for 7- 10 days and then periodically .To ensure below 140/90   .Send in readings     On e lab tests is abnormal   We should repeat this     Could be from liver from bone    Issues  Not alarming but  Needs fu repeat .  Healthy lifestyle includes : At least 150 minutes of exercise weeks  , weight at healthy levels, which is usually   BMI 19-25. Avoid trans fats and processed foods;  Increase fresh fruits and veges to 5 servings per day. And avoid sweet beverages including tea and juice. Mediterranean diet with olive oil and nuts have been noted to be heart and brain healthy . Avoid tobacco products . Limit  alcohol to  7 per week for women and 14 servings for men.  Get adequate sleep . Wear seat belts . Don't text and drive .   If the numbness right thigh is progressive contact for possible neurology evaluation.Marland KitchenMarland Kitchen

## 2015-07-08 ENCOUNTER — Other Ambulatory Visit (INDEPENDENT_AMBULATORY_CARE_PROVIDER_SITE_OTHER): Payer: BLUE CROSS/BLUE SHIELD

## 2015-07-08 DIAGNOSIS — R748 Abnormal levels of other serum enzymes: Secondary | ICD-10-CM

## 2015-07-08 DIAGNOSIS — R7301 Impaired fasting glucose: Secondary | ICD-10-CM | POA: Diagnosis not present

## 2015-07-08 LAB — ALKALINE PHOSPHATASE: ALK PHOS: 139 U/L — AB (ref 39–117)

## 2015-07-08 LAB — GAMMA GT: GGT: 22 U/L (ref 7–51)

## 2015-07-08 LAB — HEMOGLOBIN A1C: HEMOGLOBIN A1C: 5.9 % (ref 4.6–6.5)

## 2015-07-12 ENCOUNTER — Encounter: Payer: Self-pay | Admitting: Internal Medicine

## 2015-07-17 LAB — NUCLEOTIDASE, 5', BLOOD: 5-Nucleotidase: 4 U/L (ref <11)

## 2015-10-15 ENCOUNTER — Ambulatory Visit (INDEPENDENT_AMBULATORY_CARE_PROVIDER_SITE_OTHER): Payer: BLUE CROSS/BLUE SHIELD | Admitting: Internal Medicine

## 2015-10-15 ENCOUNTER — Encounter: Payer: Self-pay | Admitting: Internal Medicine

## 2015-10-15 VITALS — BP 138/82 | Temp 98.4°F | Wt 242.2 lb

## 2015-10-15 DIAGNOSIS — R51 Headache: Secondary | ICD-10-CM

## 2015-10-15 DIAGNOSIS — R519 Headache, unspecified: Secondary | ICD-10-CM

## 2015-10-15 DIAGNOSIS — Z23 Encounter for immunization: Secondary | ICD-10-CM

## 2015-10-15 DIAGNOSIS — R6889 Other general symptoms and signs: Secondary | ICD-10-CM | POA: Diagnosis not present

## 2015-10-15 NOTE — Progress Notes (Signed)
Pre visit review using our clinic review tool, if applicable. No additional management support is needed unless otherwise documented below in the visit note.  Chief Complaint  Patient presents with  . Ear Problem    Rt Ear.  Sometimes feels her heart beating in her ear and sometimes feels like there is something in her ear.    HPI: Patient Susan Peters  comes in today for SDA for  new problem evaluation.  Feels liek heart beat in ear    For 10 days comes and goes and  No fever  Pollie Meyer tylenol.  Not sleep awakenings  ? If positional    .  No pitch .    To it  Like a heart beat.  Not tinnitus  Like  Muffled .   In ear  Had uris type sx  ? Right  ear weeks ago   Water in ear  Now better . Used q tip not deep.  No hx of same . Not assoc with exdrcise/ if positional Hx of  Cluster headaches.     Hx of migraines  .   nmo inc in ha pattern  ROS: See pertinent positives and negatives per HPI. No numbness weakness diploplia  Neuro sx  bp usually good   Past Medical History  Diagnosis Date  . Anemia   . Back pain   . Migraine     long standing 3-4 x per month takes med  . Arthritis   . Hx of bacterial pneumonia     Family History  Problem Relation Age of Onset  . Arthritis Mother   . Cancer Mother     Uterus  . Hyperlipidemia Mother   . Stroke Mother   . Heart disease Mother   . Hypertension Mother   . Diabetes Mother   . Hyperlipidemia Father   . Heart disease Father   . Hypertension Father   . Early death Father   . Cancer Maternal Aunt   . Hypertension Maternal Aunt   . Diabetes Maternal Aunt   . Heart disease Paternal Uncle   . Arthritis Maternal Grandmother   . Stroke Maternal Grandmother   . Hypertension Maternal Grandmother   . Arthritis Maternal Grandfather   . Heart disease Maternal Grandfather   . Hypertension Maternal Grandfather   . Arthritis Paternal Grandmother   . Heart disease Paternal Grandmother   . Hypertension Paternal Grandmother   . Diabetes  Paternal Grandmother   . Arthritis Paternal Grandfather   . Heart disease Paternal Grandfather   . Stroke Paternal Grandfather   . Hypertension Paternal Grandfather   . Kidney disease Paternal Grandfather   . Cancer Maternal Aunt     Lung  . Diabetes Maternal Aunt   . Diabetes Cousin     Social History   Social History  . Marital Status: Single    Spouse Name: N/A  . Number of Children: N/A  . Years of Education: N/A   Social History Main Topics  . Smoking status: Current Every Day Smoker -- 0.50 packs/day    Types: Cigarettes  . Smokeless tobacco: Never Used  . Alcohol Use: 0.0 oz/week    0 Standard drinks or equivalent per week     Comment: social  . Drug Use: No  . Sexual Activity: Not Asked   Other Topics Concern  . 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   No facility-administered medications prior to visit.     EXAM:  BP 138/82 mmHg  Temp(Src) 98.4 F (36.9 C) (Oral)  Wt 242 lb 3.2 oz (109.861 kg)  LMP 11/25/2014  Body mass index is 43.57 kg/(m^2).  GENERAL: vitals reviewed and listed above, alert, oriented, appears well hydrated and in no acute distress HEENT: atraumatic, conjunctiva  clear, no obvious abnormalities on inspection of external nose and ears tmx look ok  No bruit headrd neck eye cranium  Nor heart .   Nares ? Slight congestion OP : no lesion edema or exudate  NECK: no obvious masses on inspection palpation   CV: HRRR, no clubbing cyanosis or  peripheral edema nl cap refill  No g m or radiation no bruit MS: moves all extremities without noticeable focal  Abnormality Neuro grossly non focal  PSYCH: pleasant and  cooperative, no obvious depression or anxiety   ASSESSMENT AND PLAN:  Discussed the following assessment and plan:  Ear, nose, and throat symptom - hear beat in ear but not really pusatile  tinnitus  and no bruits heard on exam  ? if eutschain tube dysfunction vs other   no evidence of vascular  on exam.   Need for prophylactic vaccination and inoculation against influenza - Plan: Flu Vaccine QUAD 36+ mos PF IM (Fluarix & Fluzone Quad PF)  Frequent headaches - no new sx  not part assoc with new sx   -Patient advised to return or notify health care team  if symptoms worsen ,persist or new concerns arise.  Patient Instructions  Uncertain cause of  Your symptoms. Exam is good . Try adding nasal cortisone .   2 sprays each nostril  Every day .  Consider seeing ENT.      If not help after 2 weeks contact us and we will do  Referral to ENT to get opinion.   Repeat bp  138/82  Ok .       Neta MendsWanda K. Leronda Lewers M.D.

## 2015-10-15 NOTE — Patient Instructions (Signed)
Uncertain cause of  Your symptoms. Exam is good . Try adding nasal cortisone .   2 sprays each nostril  Every day .  Consider seeing ENT.      If not help after 2 weeks contact us and we will do  Referral to ENT to get opinion.   Repeat bp  138/82  Ok .

## 2015-10-15 NOTE — Assessment & Plan Note (Signed)
Uses otc med as needed 1-2 x per week.

## 2015-12-21 ENCOUNTER — Encounter: Payer: Self-pay | Admitting: Internal Medicine

## 2015-12-25 ENCOUNTER — Telehealth: Payer: Self-pay | Admitting: Internal Medicine

## 2015-12-25 NOTE — Telephone Encounter (Signed)
Pt should have vitamin d, alkaline phophatase and urinalysis w/micro done [facility].  Is elevated alkaline phosphatase the dx code for all?

## 2015-12-25 NOTE — Telephone Encounter (Signed)
That plus abnormal urinalysis ( had 1+ blood) need microscopic

## 2015-12-25 NOTE — Telephone Encounter (Signed)
Patient is schedule to got to [facility] on tomorrow and patient is wanting to know what lab work she supposed to be getting done

## 2015-12-26 ENCOUNTER — Other Ambulatory Visit: Payer: BLUE CROSS/BLUE SHIELD

## 2015-12-26 ENCOUNTER — Other Ambulatory Visit: Payer: Self-pay | Admitting: Family Medicine

## 2015-12-26 DIAGNOSIS — R8290 Unspecified abnormal findings in urine: Secondary | ICD-10-CM

## 2015-12-26 DIAGNOSIS — R748 Abnormal levels of other serum enzymes: Secondary | ICD-10-CM

## 2015-12-26 NOTE — Telephone Encounter (Signed)
Lab orders placed [facility]

## 2016-02-03 ENCOUNTER — Ambulatory Visit (INDEPENDENT_AMBULATORY_CARE_PROVIDER_SITE_OTHER): Payer: BLUE CROSS/BLUE SHIELD | Admitting: Internal Medicine

## 2016-02-03 ENCOUNTER — Encounter: Payer: Self-pay | Admitting: Internal Medicine

## 2016-02-03 VITALS — BP 118/70 | HR 85 | Temp 98.7°F | Wt 236.7 lb

## 2016-02-03 DIAGNOSIS — R6889 Other general symptoms and signs: Secondary | ICD-10-CM | POA: Diagnosis not present

## 2016-02-03 DIAGNOSIS — F17200 Nicotine dependence, unspecified, uncomplicated: Secondary | ICD-10-CM | POA: Diagnosis not present

## 2016-02-03 MED ORDER — OSELTAMIVIR PHOSPHATE 75 MG PO CAPS: 75.0000 mg | ORAL_CAPSULE | Freq: Two times a day (BID) | ORAL | Status: DC

## 2016-02-03 NOTE — Progress Notes (Signed)
Pre visit review using our clinic review tool, if applicable. No additional management support is needed unless otherwise documented below in the visit note.   Chief Complaint  Patient presents with  . Cough    Started yesterday  . Headache  . Fever  . Chills  . Nasal Congestion    HPI: Patient Susan Peters  comes in today for SDA for  new problem evaluation. Cough ha  Then stuffiness.for a few days and   Then fever today   At work  Chills  . Tylenol since. Sent home from work  Had flu vaccine.  Worse sx congestion   Face pressure  Took theraflu and  Severe sinus .      No vomiting  Feels sob somehwat achy chills    No tobacco for the last few days  Had lfu vaccine this year ROS: See pertinent positives and negatives per HPI.  Past Medical History  Diagnosis Date  . Anemia   . Back pain   . Migraine     long standing 3-4 x per month takes med  . Arthritis   . Hx of bacterial pneumonia     Family History  Problem Relation Age of Onset  . Arthritis Mother   . Cancer Mother     Uterus  . Hyperlipidemia Mother   . Stroke Mother   . Heart disease Mother   . Hypertension Mother   . Diabetes Mother   . Hyperlipidemia Father   . Heart disease Father   . Hypertension Father   . Early death Father   . Cancer Maternal Aunt   . Hypertension Maternal Aunt   . Diabetes Maternal Aunt   . Heart disease Paternal Uncle   . Arthritis Maternal Grandmother   . Stroke Maternal Grandmother   . Hypertension Maternal Grandmother   . Arthritis Maternal Grandfather   . Heart disease Maternal Grandfather   . Hypertension Maternal Grandfather   . Arthritis Paternal Grandmother   . Heart disease Paternal Grandmother   . Hypertension Paternal Grandmother   . Diabetes Paternal Grandmother   . Arthritis Paternal Grandfather   . Heart disease Paternal Grandfather   . Stroke Paternal Grandfather   . Hypertension Paternal Grandfather   . Kidney disease Paternal Grandfather   . Cancer  Maternal Aunt     Lung  . Diabetes Maternal Aunt   . Diabetes Cousin     Social History   Social History  . Marital Status: Single    Spouse Name: N/A  . Number of Children: N/A  . Years of Education: N/A   Social History Main Topics  . Smoking status: Current Every Day Smoker -- 0.50 packs/day    Types: Cigarettes  . Smokeless tobacco: Never Used  . Alcohol Use: 0.0 oz/week    0 Standard drinks or equivalent per week     Comment: social  . Drug Use: No  . Sexual Activity: Not Asked   Other Topics Concern  . 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   No facility-administered medications prior to visit.     EXAM:  BP 118/70 mmHg  Pulse 85  Temp(Src) 98.7 F (37.1 C) (Oral)  Wt 236 lb 11.2 oz (107.366 kg)  SpO2 97%  LMP 11/25/2014  Body mass index is 42.58 kg/(m^2). WDWN in NAD  quiet respirations; verycongested  Mouth breathing somewhat hoarse. Non toxic .smell of tobacco smoke  HEENT: Normocephalic ;atraumatic , Eyes;  PERRL, EOMs  Full, lids and conjunctiva clear,,Ears: no deformities, canals nl, TM landmarks normal, Nose: no deformity white yellow dc 3+ congested;face minimally tender Mouth : OP clear without lesion or edema . Neck: Supple without adenopathy or masses or bruits Chest:  Clear to A&P without wheezes rales or rhonchi CV:  S1-S2 no gallops or murmurs peripheral perfusion is normal Skin :nl perfusion and no acute rashes   ASSESSMENT AND PLAN:  Discussed the following assessment and plan:  Flu-like symptoms - with uri sinus pressure  sx rx expectant management  options of tamiflu  w early fever.   Tobacco use disorder  - counseled   -Patient advised to return or notify health care team  if symptoms worsen ,persist or new concerns arise.  Patient Instructions  This acts like a viral respiratory infection  Possibly flu with severity of symptoms .  Rest fluids to avoid dehydration. Can begin tamiflu. Right away for best help.   . If this is influenza can decrease fever day s.   For head Congestion :  Sudafed or  mucinex dm in day  Afrin( generic ok) NS at night  To help relieve sinus pressure and congestion . These meds don"t cure but can help symptoms until your body heals . Cough can get worse before gets better  . Continue  Efforts for tobacco free! Contact us iof fever not better by wed Thursday or shortness of breath  Localized pain . without relief     Neta Mends. Kuzey Ogata M.D.

## 2016-02-03 NOTE — Patient Instructions (Addendum)
This acts like a viral respiratory infection  Possibly flu with severity of symptoms .  Rest fluids to avoid dehydration. Can begin tamiflu. Right away for best help.   . If this is influenza can decrease fever day s.   For head Congestion :  Sudafed or  mucinex dm in day  Afrin( generic ok) NS at night  To help relieve sinus pressure and congestion . These meds don"t cure but can help symptoms until your body heals . Cough can get worse before gets better  . Continue  Efforts for tobacco free! Contact us iof fever not better by wed Thursday or shortness of breath  Localized pain . without relief

## 2016-04-13 ENCOUNTER — Ambulatory Visit (INDEPENDENT_AMBULATORY_CARE_PROVIDER_SITE_OTHER)
Admission: RE | Admit: 2016-04-13 | Discharge: 2016-04-13 | Disposition: A | Discharge status: Home / Self Care | Payer: BLUE CROSS/BLUE SHIELD | Source: Ambulatory Visit | Attending: Internal Medicine | Admitting: Internal Medicine

## 2016-04-13 ENCOUNTER — Telehealth: Payer: Self-pay | Admitting: Internal Medicine

## 2016-04-13 ENCOUNTER — Ambulatory Visit (INDEPENDENT_AMBULATORY_CARE_PROVIDER_SITE_OTHER): Payer: BLUE CROSS/BLUE SHIELD | Admitting: Internal Medicine

## 2016-04-13 ENCOUNTER — Encounter: Payer: Self-pay | Admitting: Internal Medicine

## 2016-04-13 VITALS — BP 126/70 | HR 76 | Temp 98.1°F | Wt 242.4 lb

## 2016-04-13 DIAGNOSIS — J22 Unspecified acute lower respiratory infection: Secondary | ICD-10-CM

## 2016-04-13 DIAGNOSIS — J988 Other specified respiratory disorders: Secondary | ICD-10-CM

## 2016-04-13 DIAGNOSIS — J40 Bronchitis, not specified as acute or chronic: Secondary | ICD-10-CM | POA: Diagnosis not present

## 2016-04-13 DIAGNOSIS — R05 Cough: Secondary | ICD-10-CM | POA: Diagnosis not present

## 2016-04-13 DIAGNOSIS — F17200 Nicotine dependence, unspecified, uncomplicated: Secondary | ICD-10-CM

## 2016-04-13 MED ORDER — DOXYCYCLINE HYCLATE 100 MG PO TABS: 100.0000 mg | ORAL_TABLET | Freq: Two times a day (BID) | ORAL | Status: DC

## 2016-04-13 MED ORDER — IPRATROPIUM-ALBUTEROL 0.5-2.5 (3) MG/3ML IN SOLN
3.0000 mL | Freq: Once | RESPIRATORY_TRACT | Status: AC
  Administered 2016-04-13: 3 mL via RESPIRATORY_TRACT

## 2016-04-13 NOTE — Patient Instructions (Addendum)
Antibiotic because of your smoking hx and poss blood in phlem although could be from uypper airway . Get  chest x ray  But I dont hear pneumonia at this time .  Cough med for comfort  mucinex d  . Stop tobacco . Expect improviment in the next 3 days   Although cough can last  1-2 weeks .     Acute Bronchitis Bronchitis is inflammation of the airways that extend from the windpipe into the lungs (bronchi). The inflammation often causes mucus to develop. This leads to a cough, which is the most common symptom of bronchitis.  In acute bronchitis, the condition usually develops suddenly and goes away over time, usually in a couple weeks. Smoking, allergies, and asthma can make bronchitis worse. Repeated episodes of bronchitis may cause further lung problems.  CAUSES Acute bronchitis is most often caused by the same virus that causes a cold. The virus can spread from person to person (contagious) through coughing, sneezing, and touching contaminated objects. SIGNS AND SYMPTOMS   Cough.   Fever.   Coughing up mucus.   Body aches.   Chest congestion.   Chills.   Shortness of breath.   Sore throat.  DIAGNOSIS  Acute bronchitis is usually diagnosed through a physical exam. Your health care provider will also ask you questions about your medical history. Tests, such as chest X-rays, are sometimes done to rule out other conditions.  TREATMENT  Acute bronchitis usually goes away in a couple weeks. Oftentimes, no medical treatment is necessary. Medicines are sometimes given for relief of fever or cough. Antibiotic medicines are usually not needed but may be prescribed in certain situations. In some cases, an inhaler may be recommended to help reduce shortness of breath and control the cough. A cool mist vaporizer may also be used to help thin bronchial secretions and make it easier to clear the chest.  HOME CARE INSTRUCTIONS  Get plenty of rest.   Drink enough fluids to keep your  urine clear or pale yellow (unless you have a medical condition that requires fluid restriction). Increasing fluids may help thin your respiratory secretions (sputum) and reduce chest congestion, and it will prevent dehydration.   Take medicines only as directed by your health care provider.  If you were prescribed an antibiotic medicine, finish it all even if you start to feel better.  Avoid smoking and secondhand smoke. Exposure to cigarette smoke or irritating chemicals will make bronchitis worse. If you are a smoker, consider using nicotine gum or skin patches to help control withdrawal symptoms. Quitting smoking will help your lungs heal faster.   Reduce the chances of another bout of acute bronchitis by washing your hands frequently, avoiding people with cold symptoms, and trying not to touch your hands to your mouth, nose, or eyes.   Keep all follow-up visits as directed by your health care provider.  SEEK MEDICAL CARE IF: Your symptoms do not improve after 1 week of treatment.  SEEK IMMEDIATE MEDICAL CARE IF:  You develop an increased fever or chills.   You have chest pain.   You have severe shortness of breath.  You have bloody sputum.   You develop dehydration.  You faint or repeatedly feel like you are going to pass out.  You develop repeated vomiting.  You develop a severe headache. MAKE SURE YOU:   Understand these instructions.  Will watch your condition.  Will get help right away if you are not doing well or get worse.  This information is not intended to replace advice given to you by your health care provider. Make sure you discuss any questions you have with your health care provider.   Document Released: 01/21/2005 Document Revised: 01/04/2015 Document Reviewed: 06/06/2013 Elsevier Interactive Patient Education [employer].

## 2016-04-13 NOTE — Telephone Encounter (Signed)
Pt notified of results by telephone. 

## 2016-04-13 NOTE — Progress Notes (Signed)
Pre visit review using our clinic review tool, if applicable. No additional management support is needed unless otherwise documented below in the visit note.  Chief Complaint  Patient presents with  . Nasal Congestion    Started last Wednesday.    . Cough  . Headache  . Post Nasal Drip    HPI: Susan Peters 50 y.o. comes in today with 6 days of upper respiratory congestion and cough and feeling bad all over. She is using over-the-counter and Tylenol every 4-6 hours but her symptoms continue. Uncertain if she hasn't had a fever. She's coughing up yellow phlegm occasionally pink. Still smoking about a half a pack per day. Question some wheezing shortness of breath. Some of this began after an extreme exposure to pollen although she doesn't have a history of seasonal allergies of significance. ROS: See pertinent positives and negatives per HPI.  Past Medical History  Diagnosis Date  . Anemia   . Back pain   . Migraine     long standing 3-4 x per month takes med  . Arthritis   . Hx of bacterial pneumonia     Family History  Problem Relation Age of Onset  . Arthritis Mother   . Cancer Mother     Uterus  . Hyperlipidemia Mother   . Stroke Mother   . Heart disease Mother   . Hypertension Mother   . Diabetes Mother   . Hyperlipidemia Father   . Heart disease Father   . Hypertension Father   . Early death Father   . Cancer Maternal Aunt   . Hypertension Maternal Aunt   . Diabetes Maternal Aunt   . Heart disease Paternal Uncle   . Arthritis Maternal Grandmother   . Stroke Maternal Grandmother   . Hypertension Maternal Grandmother   . Arthritis Maternal Grandfather   . Heart disease Maternal Grandfather   . Hypertension Maternal Grandfather   . Arthritis Paternal Grandmother   . Heart disease Paternal Grandmother   . Hypertension Paternal Grandmother   . Diabetes Paternal Grandmother   . Arthritis Paternal Grandfather   . Heart disease Paternal Grandfather   . Stroke  Paternal Grandfather   . Hypertension Paternal Grandfather   . Kidney disease Paternal Grandfather   . Cancer Maternal Aunt     Lung  . Diabetes Maternal Aunt   . Diabetes Cousin     Social History   Social History  . Marital Status: Single    Spouse Name: N/A  . Number of Children: N/A  . Years of Education: N/A   Social History Main Topics  . Smoking status: Current Every Day Smoker -- 0.50 packs/day    Types: Cigarettes  . Smokeless tobacco: Never Used  . Alcohol Use: 0.0 oz/week    0 Standard drinks or equivalent per week     Comment: social  . Drug Use: No  . Sexual Activity: Not Asked   Other Topics Concern  . 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  oseltamivir (TAMIFLU) 75 MG capsule Take 1 capsule (75 mg total) by mouth 2 (two) times daily. 14 capsule 0   No facility-administered medications prior to visit.     EXAM:  BP 126/70 mmHg  Pulse 76  Temp(Src) 98.1 F (36.7 C) (Oral)  Wt 242 lb 6.4 oz (109.952 kg)  SpO2 98%  LMP 11/25/2014  Body mass index is 43.6 kg/(m^2). WDWN in NAD  quiet respirations; mildly congested  somewhat hoarse. Non toxic .  Looks iunfocnfortable   Cough  uypper congestion HEENT: Normocephalic ;atraumatic , Eyes;  PERRL, EOMs  Full, lids and conjunctiva clear,,Ears: no deformities, canals nl, TM landmarks normal, Nose: no deformity or discharge but congested;face minimally tender Mouth : OP clear without lesion or edema . Neck: Supple without adenopathy or masses or bruits Chest:   Tight  ? Exp wheeze no rales  After neb inc air flow  And upper airway sounds  CV:  S1-S2 no  gallops or murmurs peripheral perfusion is normal Skin :nl perfusion and no acute rashes  MS: moves all extremities without noticeable focal  abnormality PSYCH: pleasant and cooperative, no obvious depression or anxietyObviously doesn't feel well. phlelgm couldy   Pink yellow  ASSESSMENT AND PLAN:  Discussed the following assessment and plan:  Tobacco use disorder  Acute respiratory infection - Plan: ipratropium-albuterol (DUONEB) 0.5-2.5 (3) MG/3ML nebulizer solution 3 mL, DG Chest 2 View  Wheezy bronchitis - tobacco uses ? of blook trace could be upper airay get x ray add antibiotic  - Plan: ipratropium-albuterol (DUONEB) 0.5-2.5 (3) MG/3ML nebulizer solution 3 mL, DG Chest 2 View Because of a history of pink phlegm and her smoking history will add antibiotic and get chest x-ray. It perhaps the whole thing is viral and will need to run its course offered an inhaler she can call for this. Expectant management. Discussed stopping smoking. He can only help her situation -Patient advised to return or notify health care team  if symptoms worsen ,persist or new concerns arise.  Patient Instructions  Antibiotic because of your smoking hx and poss blood in phlem although could be from uypper airway . Get  chest x ray  But I dont hear pneumonia at this time .  Cough med for comfort  mucinex d  . Stop tobacco . Expect improviment in the next 3 days   Although cough can last  1-2 weeks .     Acute Bronchitis Bronchitis is inflammation of the airways that extend from the windpipe into the lungs (bronchi). The inflammation often causes mucus to develop. This leads to a cough, which is the most common symptom of bronchitis.  In acute bronchitis, the condition usually develops suddenly and goes away over time, usually in a couple weeks. Smoking, allergies, and asthma can make bronchitis worse. Repeated episodes of bronchitis may cause further lung problems.  CAUSES Acute bronchitis is most often  caused by the same virus that causes a cold. The virus can spread from person to person (contagious) through coughing, sneezing, and touching contaminated objects. SIGNS AND SYMPTOMS   Cough.   Fever.   Coughing up mucus.   Body aches.   Chest congestion.   Chills.   Shortness of breath.   Sore throat.  DIAGNOSIS  Acute bronchitis is usually diagnosed through a physical exam. Your health care provider will also ask you questions about your medical history. Tests, such as chest X-rays, are sometimes done to rule out other conditions.  TREATMENT  Acute bronchitis usually  goes away in a couple weeks. Oftentimes, no medical treatment is necessary. Medicines are sometimes given for relief of fever or cough. Antibiotic medicines are usually not needed but may be prescribed in certain situations. In some cases, an inhaler may be recommended to help reduce shortness of breath and control the cough. A cool mist vaporizer may also be used to help thin bronchial secretions and make it easier to clear the chest.  HOME CARE INSTRUCTIONS  Get plenty of rest.   Drink enough fluids to keep your urine clear or pale yellow (unless you have a medical condition that requires fluid restriction). Increasing fluids may help thin your respiratory secretions (sputum) and reduce chest congestion, and it will prevent dehydration.   Take medicines only as directed by your health care provider.  If you were prescribed an antibiotic medicine, finish it all even if you start to feel better.  Avoid smoking and secondhand smoke. Exposure to cigarette smoke or irritating chemicals will make bronchitis worse. If you are a smoker, consider using nicotine gum or skin patches to help control withdrawal symptoms. Quitting smoking will help your lungs heal faster.   Reduce the chances of another bout of acute bronchitis by washing your hands frequently, avoiding people with cold symptoms, and trying not to  touch your hands to your mouth, nose, or eyes.   Keep all follow-up visits as directed by your health care provider.  SEEK MEDICAL CARE IF: Your symptoms do not improve after 1 week of treatment.  SEEK IMMEDIATE MEDICAL CARE IF:  You develop an increased fever or chills.   You have chest pain.   You have severe shortness of breath.  You have bloody sputum.   You develop dehydration.  You faint or repeatedly feel like you are going to pass out.  You develop repeated vomiting.  You develop a severe headache. MAKE SURE YOU:   Understand these instructions.  Will watch your condition.  Will get help right away if you are not doing well or get worse.   This information is not intended to replace advice given to you by your health care provider. Make sure you discuss any questions you have with your health care provider.   Document Released: 01/21/2005 Document Revised: 01/04/2015 Document Reviewed: 06/06/2013 Elsevier Interactive Patient Education 2016 [employer]      New EraWanda K. Piper Hassebrock M.D.

## 2016-04-13 NOTE — Telephone Encounter (Signed)
Please see result note X shows bronchitis  No other concerning  finding

## 2016-04-13 NOTE — Telephone Encounter (Signed)
Per triage, pt is requesting results from chest x-ray done today.

## 2016-05-11 DIAGNOSIS — M25475 Effusion, left foot: Secondary | ICD-10-CM | POA: Diagnosis not present

## 2016-05-11 DIAGNOSIS — M79672 Pain in left foot: Secondary | ICD-10-CM | POA: Diagnosis not present

## 2016-05-11 DIAGNOSIS — S9032XA Contusion of left foot, initial encounter: Secondary | ICD-10-CM | POA: Diagnosis not present

## 2016-06-03 DIAGNOSIS — M25475 Effusion, left foot: Secondary | ICD-10-CM | POA: Diagnosis not present

## 2016-06-03 DIAGNOSIS — S9032XD Contusion of left foot, subsequent encounter: Secondary | ICD-10-CM | POA: Diagnosis not present

## 2016-06-03 DIAGNOSIS — M79672 Pain in left foot: Secondary | ICD-10-CM | POA: Diagnosis not present

## 2016-06-17 DIAGNOSIS — M792 Neuralgia and neuritis, unspecified: Secondary | ICD-10-CM | POA: Diagnosis not present

## 2016-06-17 DIAGNOSIS — M542 Cervicalgia: Secondary | ICD-10-CM | POA: Diagnosis not present

## 2016-06-17 DIAGNOSIS — M5412 Radiculopathy, cervical region: Secondary | ICD-10-CM | POA: Diagnosis not present

## 2016-06-17 DIAGNOSIS — M5030 Other cervical disc degeneration, unspecified cervical region: Secondary | ICD-10-CM | POA: Diagnosis not present

## 2016-06-29 DIAGNOSIS — M5412 Radiculopathy, cervical region: Secondary | ICD-10-CM | POA: Diagnosis not present

## 2016-07-02 DIAGNOSIS — M5412 Radiculopathy, cervical region: Secondary | ICD-10-CM | POA: Diagnosis not present

## 2016-07-08 DIAGNOSIS — M5412 Radiculopathy, cervical region: Secondary | ICD-10-CM | POA: Diagnosis not present

## 2017-02-08 ENCOUNTER — Encounter: Payer: Self-pay | Admitting: Family Medicine

## 2017-02-08 ENCOUNTER — Ambulatory Visit (INDEPENDENT_AMBULATORY_CARE_PROVIDER_SITE_OTHER): Payer: BLUE CROSS/BLUE SHIELD | Admitting: Family Medicine

## 2017-02-08 VITALS — BP 138/86 | HR 90 | Temp 98.7°F | Wt 246.4 lb

## 2017-02-08 DIAGNOSIS — M940 Chondrocostal junction syndrome [Tietze]: Secondary | ICD-10-CM

## 2017-02-08 NOTE — Progress Notes (Signed)
Subjective:    Patient ID: Susan Peters, female    DOB: [birth year], 51 y.o.   MRN: [id number]  HPI  Susan Peters is a 51 year old female who presents today with pain under right breast that started yesterday. Pain is noted as a spasm that can be rated as high as an 8 but resolves spontaneously in "a couple of seconds". She reports that spasms occurred throughout the day.  Associated symptoms of tenderness to palpation is present. She denies fever, chills, sweats, congestion, chest pain, palpitations, SOB/DOE, numbness, tingling, or weakness. She is a smoker 1 ppd and has been a long time smoker starting in her teenage years. No alleviating factors and no aggravating factors noted. Treatment: No treatments at this time.  Review of Systems  Constitutional: Negative for chills, fatigue and fever.  HENT: Negative for postnasal drip and sore throat.   Respiratory: Negative for cough, shortness of breath and wheezing.   Cardiovascular: Negative for chest pain and palpitations.  Gastrointestinal: Negative for abdominal pain, diarrhea, nausea and vomiting.  Genitourinary: Negative for dysuria, hematuria and urgency.  Musculoskeletal: Negative for back pain and myalgias.  Neurological: Negative for dizziness, weakness, light-headedness and headaches.   Past Medical History:  Diagnosis Date  . Anemia   . Arthritis   . Back pain   . Hx of bacterial pneumonia   . Migraine    long standing 3-4 x per month takes med     Social History   Social History  . Marital status: Single    Spouse name: N/A  . Number of children: N/A  . Years of education: N/A   Occupational History  . Not on file.   Social History Main Topics  . Smoking status: Current Every Day Smoker    Packs/day: 0.50    Types: Cigarettes  . Smokeless tobacco: Never Used  . Alcohol use 0.0 oz/week     Comment: social  . Drug use: No  . Sexual activity: Not on file   Other Topics Concern  . 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 KNEE SURGERY Left    1982  . SHOULDER SURGERY Left    1981    Family History  Problem Relation Age of Onset  . Arthritis Mother   . Cancer Mother     Uterus  . Hyperlipidemia Mother   . Stroke Mother   . Heart disease Mother   . Hypertension Mother   . Diabetes Mother   . Hyperlipidemia Father   . Heart disease Father   . Hypertension Father   . Early death Father   . Cancer Maternal Aunt   . Hypertension Maternal Aunt   . Diabetes Maternal Aunt   . Heart disease Paternal Uncle   . Arthritis Maternal Grandmother   . Stroke Maternal Grandmother   . Hypertension Maternal Grandmother   . Arthritis Maternal Grandfather   . Heart disease Maternal Grandfather   . Hypertension Maternal Grandfather   . Arthritis Paternal  Grandmother   . Heart disease Paternal Grandmother   . Hypertension Paternal Grandmother   . Diabetes Paternal Grandmother   . Arthritis Paternal Grandfather   . Heart disease Paternal Grandfather   . Stroke Paternal Grandfather   . Hypertension Paternal Grandfather   . Kidney disease Paternal Grandfather   . Cancer Maternal Aunt     Lung  . Diabetes Maternal Aunt   . Diabetes Cousin     No Known Allergies  No current outpatient prescriptions on file prior to visit.   No current facility-administered medications on file prior to visit.     BP 138/86 (BP Location: Left Arm, Patient Position: Sitting, Cuff Size: Large)   Pulse 90   Temp 98.7 F (37.1 C) (Oral)   Wt 246 lb 6.4 oz (111.8 kg)   LMP 11/25/2014   SpO2 97%   BMI 44.35 kg/m        Objective:   Physical Exam  Constitutional:  She is oriented to person, place, and time. She appears well-developed and well-nourished.  Morbidly obese  Eyes: Pupils are equal, round, and reactive to light. No scleral icterus.  Neck: Neck supple.  Cardiovascular: Normal rate and regular rhythm.   Pulmonary/Chest: Effort normal and breath sounds normal. She has no wheezes. She has no rales.  Tenderness to palpation noted laterally under area of right breast between ribs. Pain was reproducible and patient noted this is the area that has been painful.  Abdominal: Soft. Bowel sounds are normal. There is no tenderness.  Genitourinary: No breast tenderness or discharge.  Genitourinary Comments: Breast exam completed in supine position. No tenderness or masses noted. Mammogram is UTD per patient and noted as normal with no evidence of malignancy  Lymphadenopathy:    She has no cervical adenopathy.  Neurological: She is alert and oriented to person, place, and time.  Skin: Skin is warm and dry. No rash noted.      Assessment & Plan:  1. Costochondritis Tenderness to palpation present; exam reassuring and without evidence of chest pain or SOB; suspect this is inflammatory process in nature. Advised use of ibuprofen 600 mg every 6 hours as needed for discomfort with home measures of heat/ice. 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 Ibuprofen can be beneficial for muscular pain also. Follow up advised if symptoms do not improve with treatment in 3 to 4 days, worsens, or she develops a fever, cough, or pain becomes worse.  Roddie McJulia Osmara Drummonds, FNP-C

## 2017-02-08 NOTE — Progress Notes (Signed)
Pre visit review using our clinic review tool, if applicable. No additional management support is needed unless otherwise documented below in the visit note. 

## 2017-02-08 NOTE — Patient Instructions (Addendum)
You may use ibuprofen 600 mg every 6 hours for discomfort. Please take this medication with food. If symptoms do not improve with treatment in 3 to 4 days, worsen, or you develop new symptoms such as fever or SOB, please seek medical attention.   Costochondritis Costochondritis is swelling and irritation (inflammation) of the tissue (cartilage) that connects your ribs to your breastbone (sternum). This causes pain in the front of your chest. Usually, the pain:  Starts gradually.  Is in more than one rib. This condition usually goes away on its own over time. Follow these instructions at home:  Do not do anything that makes your pain worse.  If directed, put ice on the painful area:  Put ice in a plastic bag.  Place a towel between your skin and the bag.  Leave the ice on for 20 minutes, 2-3 times a day.  If directed, put heat on the affected area as often as told by your doctor. Use the heat source that your doctor tells you to use, such as a moist heat pack or a heating pad.  Place a towel between your skin and the heat source.  Leave the heat on for 20-30 minutes.  Take off the heat if your skin turns bright red. This is very important if you cannot feel pain, heat, or cold. You may have a greater risk of getting burned.  Take over-the-counter and prescription medicines only as told by your doctor.  Return to your normal activities as told by your doctor. Ask your doctor what activities are safe for you.  Keep all follow-up visits as told by your doctor. This is important. Contact a doctor if:  You have chills or a fever.  Your pain does not go away or it gets worse.  You have a cough that does not go away. Get help right away if:  You are short of breath. This information is not intended to replace advice given to you by your health care provider. Make sure you discuss any questions you have with your health care provider. Document Released: 06/01/2008 Document  Revised: 07/03/2016 Document Reviewed: 04/08/2016 Elsevier Interactive Patient Education  2017 [employer]

## 2017-06-11 DIAGNOSIS — L821 Other seborrheic keratosis: Secondary | ICD-10-CM | POA: Diagnosis not present

## 2017-07-27 DIAGNOSIS — Z1231 Encounter for screening mammogram for malignant neoplasm of breast: Secondary | ICD-10-CM | POA: Diagnosis not present

## 2017-07-27 DIAGNOSIS — Z01419 Encounter for gynecological examination (general) (routine) without abnormal findings: Secondary | ICD-10-CM | POA: Diagnosis not present

## 2017-07-27 DIAGNOSIS — Z13820 Encounter for screening for osteoporosis: Secondary | ICD-10-CM | POA: Diagnosis not present

## 2017-07-27 DIAGNOSIS — Z6837 Body mass index (BMI) 37.0-37.9, adult: Secondary | ICD-10-CM | POA: Diagnosis not present

## 2017-08-11 DIAGNOSIS — N62 Hypertrophy of breast: Secondary | ICD-10-CM | POA: Diagnosis not present

## 2017-09-09 DIAGNOSIS — K621 Rectal polyp: Secondary | ICD-10-CM | POA: Diagnosis not present

## 2017-09-09 DIAGNOSIS — K649 Unspecified hemorrhoids: Secondary | ICD-10-CM | POA: Diagnosis not present

## 2017-09-09 DIAGNOSIS — Z1211 Encounter for screening for malignant neoplasm of colon: Secondary | ICD-10-CM | POA: Diagnosis not present

## 2017-09-09 DIAGNOSIS — K648 Other hemorrhoids: Secondary | ICD-10-CM | POA: Diagnosis not present

## 2017-09-09 DIAGNOSIS — D128 Benign neoplasm of rectum: Secondary | ICD-10-CM | POA: Diagnosis not present

## 2017-09-09 DIAGNOSIS — Z86010 Personal history of colonic polyps: Secondary | ICD-10-CM | POA: Diagnosis not present

## 2017-09-17 ENCOUNTER — Encounter: Payer: Self-pay | Admitting: Internal Medicine

## 2017-09-28 DIAGNOSIS — Z0100 Encounter for examination of eyes and vision without abnormal findings: Secondary | ICD-10-CM | POA: Diagnosis not present

## 2017-12-28 HISTORY — PX: KNEE SURGERY: SHX244

## 2018-01-24 ENCOUNTER — Ambulatory Visit: Payer: BLUE CROSS/BLUE SHIELD | Admitting: Family Medicine

## 2018-01-24 ENCOUNTER — Encounter: Payer: Self-pay | Admitting: Family Medicine

## 2018-01-24 VITALS — BP 142/80 | HR 80 | Temp 98.2°F | Wt 221.2 lb

## 2018-01-24 DIAGNOSIS — J018 Other acute sinusitis: Secondary | ICD-10-CM

## 2018-01-24 MED ORDER — AZITHROMYCIN 250 MG PO TABS: ORAL_TABLET | ORAL | 0 refills | Status: DC

## 2018-01-24 NOTE — Progress Notes (Signed)
   Subjective:    Patient ID: Susan Peters, female    DOB: 1966/11/26, 52 y.o.   MRN: [id number]  HPI Here for 3 days of sinus pressure, ear pain, PND, ST, and a dry cough. On Mucinex.    Review of Systems  Constitutional: Negative.   HENT: Positive for congestion, ear pain, postnasal drip, sinus pressure, sinus pain and sore throat.   Eyes: Negative.   Respiratory: Positive for cough.        Objective:   Physical Exam  Constitutional: She appears well-developed and well-nourished.  HENT:  Right Ear: External ear normal.  Left Ear: External ear normal.  Nose: Nose normal.  Mouth/Throat: Oropharynx is clear and moist.  Eyes: Conjunctivae are normal.  Neck: No thyromegaly present.  Pulmonary/Chest: Effort normal and breath sounds normal. No respiratory distress. She has no wheezes. She has no rales.  Lymphadenopathy:    She has no cervical adenopathy.          Assessment & Plan:  Sinusitis, treat with a Zpack.  Gershon CraneStephen Markham Dumlao, MD

## 2018-01-31 ENCOUNTER — Ambulatory Visit: Payer: BLUE CROSS/BLUE SHIELD | Admitting: Internal Medicine

## 2018-01-31 ENCOUNTER — Encounter: Payer: Self-pay | Admitting: Internal Medicine

## 2018-01-31 VITALS — BP 132/82 | HR 96 | Temp 97.5°F | Wt 218.6 lb

## 2018-01-31 DIAGNOSIS — F17200 Nicotine dependence, unspecified, uncomplicated: Secondary | ICD-10-CM

## 2018-01-31 DIAGNOSIS — R509 Fever, unspecified: Secondary | ICD-10-CM

## 2018-01-31 DIAGNOSIS — R05 Cough: Secondary | ICD-10-CM | POA: Diagnosis not present

## 2018-01-31 LAB — POCT INFLUENZA A/B
Influenza A, POC: NEGATIVE
Influenza B, POC: NEGATIVE

## 2018-01-31 NOTE — Progress Notes (Signed)
Chief Complaint  Patient presents with  . Cough    x 1.5 weeks with congestion and fever. Not feeling any better after zpak. Fever  99-100; worse at night. Using Mucinex. Mucus is thick and yellow.     HPI: Susan Peters 52 y.o.   Sx for  1.5 week    And seen dr Clent Ridges 1 28  and got  z pack and not  All better      Now  Getting worse in chest  Now.  Drainage.  Fever came back  99 100  Last night  Seeing things .  Last night  Mucinex.  ? Fever  Feels sob  But nouth breathing   inially sore thrioat headaches and right ear  And now above .     ROS: See pertinent positives and negatives per HPI. No syncope hemoptysis tobacco about 1ppd  Past Medical History:  Diagnosis Date  . Anemia   . Arthritis   . Back pain   . Hx of bacterial pneumonia   . Migraine    long standing 3-4 x per month takes med    Family History  Problem Relation Age of Onset  . Arthritis Mother   . Cancer Mother        Uterus  . Hyperlipidemia Mother   . Stroke Mother   . Heart disease Mother   . Hypertension Mother   . Diabetes Mother   . Hyperlipidemia Father   . Heart disease Father   . Hypertension Father   . Early death Father   . Cancer Maternal Aunt   . Hypertension Maternal Aunt   . Diabetes Maternal Aunt   . Heart disease Paternal Uncle   . Arthritis Maternal Grandmother   . Stroke Maternal Grandmother   . Hypertension Maternal Grandmother   . Arthritis Maternal Grandfather   . Heart disease Maternal Grandfather   . Hypertension Maternal Grandfather   . Arthritis Paternal Grandmother   . Heart disease Paternal Grandmother   . Hypertension Paternal Grandmother   . Diabetes Paternal Grandmother   . Arthritis Paternal Grandfather   . Heart disease Paternal Grandfather   . Stroke Paternal Grandfather   . Hypertension Paternal Grandfather   . Kidney disease Paternal Grandfather   . Cancer Maternal Aunt        Lung  . Diabetes Maternal Aunt   . Diabetes Cousin     Social History    Socioeconomic History  . Marital status: Single    Spouse name: None  . Number of children: None  . Years of education: None  . Highest education level: None  Social Needs  . Financial resource strain: None  . Food insecurity - worry: None  . Food insecurity - inability: None  . Transportation needs - medical: None  . Transportation needs - non-medical: None  Occupational History  . None  Tobacco Use  . Smoking status: Current Every Day Smoker    Packs/day: 0.50    Types: Cigarettes  . Smokeless tobacco: Never Used  Substance and Sexual Activity  . Alcohol use: Yes    Alcohol/week: 0.0 oz    Comment: social  . Drug use: No  . Sexual activity: None  Other Topics Concern  . 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 azithromycin (ZITHROMAX) 250 MG tablet As directed (Patient not taking: Reported on 01/31/2018) 6 tablet 0   No facility-administered medications prior to visit.      EXAM:  BP 132/82 (BP Location: Right Arm, Patient Position: Sitting, Cuff Size: Normal)   Pulse 96   Temp (!) 97.5 F (36.4 C) (Oral)   Wt 218 lb 9.6 oz (99.2 kg)   LMP 11/25/2014   SpO2 98%   BMI 39.35 kg/m   Body mass index is 39.35 kg/m.  GENERAL: vitals reviewed and listed above, alert, oriented, appears well hydrated and  very congested nasally and cough   Mouth breathing   Hoarse no labored breathing ?  HEENT: atraumatic, conjunctiva  clear, no obvious abnormalities on inspection of external nose and earst m flushed but nl LM  OP : no lesion edema or exudate 2+ red  Face  Min tender   NECK: no obvious masses on inspection  palpation  LUNGS: clear to auscultation bilaterally, no wheezes, rales or rhonchi, ? Air movement CV: HRRR, no clubbing cyanosis  nl cap refill  MS: moves all extremities without noticeable focal  abnormality PSYCH: pleasant and cooperative, no obvious depression or anxiety  ASSESSMENT AND PLAN:  Discussed the following assessment and plan:  Cough with fever - Plan: DG Chest 2 View, POC Influenza A/B  Tobacco use disorder - advised stopping denies chronic sx  finished  z pack but less than 10 days from start   Viral vs  Other  But has secondary wave of fever  And  Sx .   Get x ray flu screen   ? Relapsing viral vs other  . Could have partly rx  Sinusitis   azitho resistant   common -Patient advised to return or notify health care team  if symptoms worsen ,persist or new concerns arise.  Patient Instructions  get flu screen and chest x ray today .  consideration of   Sinusitis   unresponsive to  Current antibiotic  Will be notified   If results when available.           Neta MendsWanda K. Panosh M.D.

## 2018-01-31 NOTE — Patient Instructions (Signed)
get flu screen and chest x ray today .  consideration of   Sinusitis   unresponsive to  Current antibiotic  Will be notified   If results when available.

## 2018-02-01 ENCOUNTER — Ambulatory Visit (INDEPENDENT_AMBULATORY_CARE_PROVIDER_SITE_OTHER)
Admission: RE | Admit: 2018-02-01 | Discharge: 2018-02-01 | Disposition: A | Discharge status: Home / Self Care | Payer: BLUE CROSS/BLUE SHIELD | Source: Ambulatory Visit | Attending: Internal Medicine | Admitting: Internal Medicine

## 2018-02-01 ENCOUNTER — Telehealth: Payer: Self-pay | Admitting: Internal Medicine

## 2018-02-01 DIAGNOSIS — R059 Cough, unspecified: Secondary | ICD-10-CM

## 2018-02-01 DIAGNOSIS — R509 Fever, unspecified: Secondary | ICD-10-CM | POA: Diagnosis not present

## 2018-02-01 DIAGNOSIS — R05 Cough: Secondary | ICD-10-CM | POA: Diagnosis not present

## 2018-02-01 MED ORDER — AMOXICILLIN-POT CLAVULANATE 875-125 MG PO TABS: 1.0000 | ORAL_TABLET | Freq: Two times a day (BID) | ORAL | 0 refills | Status: AC

## 2018-02-01 NOTE — Telephone Encounter (Signed)
Patient notified POC Flu test negative. Awaiting CXR results.

## 2018-02-01 NOTE — Telephone Encounter (Signed)
Copied from CRM [id number]. Topic: Quick Communication - Lab Results >> Feb 01, 2018  8:51 AM Herby AbrahamJohnson, Shiquita C wrote: Pt called in to request her lab results.   CB: [phone]

## 2018-02-01 NOTE — Telephone Encounter (Signed)
Pt notified of CXR results and verbalized understanding.

## 2018-04-05 DIAGNOSIS — M25561 Pain in right knee: Secondary | ICD-10-CM | POA: Diagnosis not present

## 2018-04-06 DIAGNOSIS — M25561 Pain in right knee: Secondary | ICD-10-CM | POA: Diagnosis not present

## 2018-04-12 DIAGNOSIS — M25561 Pain in right knee: Secondary | ICD-10-CM | POA: Diagnosis not present

## 2018-04-18 ENCOUNTER — Ambulatory Visit: Payer: BLUE CROSS/BLUE SHIELD | Admitting: Internal Medicine

## 2018-04-18 ENCOUNTER — Encounter: Payer: Self-pay | Admitting: Internal Medicine

## 2018-04-18 VITALS — BP 126/82 | HR 91 | Temp 98.5°F | Wt 227.2 lb

## 2018-04-18 DIAGNOSIS — B9789 Other viral agents as the cause of diseases classified elsewhere: Secondary | ICD-10-CM | POA: Diagnosis not present

## 2018-04-18 DIAGNOSIS — J40 Bronchitis, not specified as acute or chronic: Secondary | ICD-10-CM

## 2018-04-18 DIAGNOSIS — F17200 Nicotine dependence, unspecified, uncomplicated: Secondary | ICD-10-CM | POA: Diagnosis not present

## 2018-04-18 DIAGNOSIS — J988 Other specified respiratory disorders: Secondary | ICD-10-CM | POA: Diagnosis not present

## 2018-04-18 MED ORDER — ALBUTEROL SULFATE HFA 108 (90 BASE) MCG/ACT IN AERS: 2.0000 | INHALATION_SPRAY | Freq: Four times a day (QID) | RESPIRATORY_TRACT | 1 refills | Status: DC | PRN

## 2018-04-18 MED ORDER — PROMETHAZINE-DM 6.25-15 MG/5ML PO SYRP: 5.0000 mL | ORAL_SOLUTION | Freq: Four times a day (QID) | ORAL | 0 refills | Status: DC | PRN

## 2018-04-18 NOTE — Progress Notes (Signed)
Chief Complaint  Patient presents with  . Cough    Cough and congestion x 3 days. Coughing up yellow, thick mucous. Some wheezing and SOB. denies fever. Some head congestion. Taking mucinex for sx's, not helping.     HPI: Susan Peters 52 y.o. come in for  sda   Here with daughter today  Onset 3 days ago ur congsetion and then cough and wheezy feeling no blood no fever taking tylenol  No local pain  No fever   Taking tylenol and mucinex .  Hard to get rest .   1ppd.  Less since this started.  Not ready to stop  ROS: See pertinent positives and negatives per HPI. No hemoptysis   Face pain   Past Medical History:  Diagnosis Date  . Anemia   . Arthritis   . Back pain   . Hx of bacterial pneumonia   . Migraine    long standing 3-4 x per month takes med    Family History  Problem Relation Age of Onset  . Arthritis Mother   . Cancer Mother        Uterus  . Hyperlipidemia Mother   . Stroke Mother   . Heart disease Mother   . Hypertension Mother   . Diabetes Mother   . Hyperlipidemia Father   . Heart disease Father   . Hypertension Father   . Early death Father   . Cancer Maternal Aunt   . Hypertension Maternal Aunt   . Diabetes Maternal Aunt   . Heart disease Paternal Uncle   . Arthritis Maternal Grandmother   . Stroke Maternal Grandmother   . Hypertension Maternal Grandmother   . Arthritis Maternal Grandfather   . Heart disease Maternal Grandfather   . Hypertension Maternal Grandfather   . Arthritis Paternal Grandmother   . Heart disease Paternal Grandmother   . Hypertension Paternal Grandmother   . Diabetes Paternal Grandmother   . Arthritis Paternal Grandfather   . Heart disease Paternal Grandfather   . Stroke Paternal Grandfather   . Hypertension Paternal Grandfather   . Kidney disease Paternal Grandfather   . Cancer Maternal Aunt        Lung  . Diabetes Maternal Aunt   . Diabetes Cousin     Social History   Socioeconomic History  . Marital status:  Single    Spouse name: Not on file  . Number of children: Not on file  . Years of education: Not on file  . Highest education level: Not on file  Occupational History  . Not on file  Social Needs  . Financial resource strain: Not on file  . Food insecurity:    Worry: Not on file    Inability: Not on file  . Transportation needs:    Medical: Not on file    Non-medical: Not on file  Tobacco Use  . Smoking status: Current Every Day Smoker    Packs/day: 0.50    Types: Cigarettes  . Smokeless tobacco: Never Used  Substance and Sexual Activity  . Alcohol use: Yes    Alcohol/week: 0.0 oz    Comment: social  . Drug use: No  . Sexual activity: Not on file  Lifestyle  . Physical activity:    Days per week: Not on file    Minutes per session: Not on file  . Stress: Not on file  Relationships  . Social connections:    Talks on phone: Not on file    Gets together:  Not on file    Attends religious service: Not on file    Active member of club or organization: Not on file    Attends meetings of clubs or organizations: Not on file    Relationship status: Not on file  Other Topics Concern  . 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   No facility-administered medications prior to visit.      EXAM:  BP 126/82 (BP Location: Right Arm, Patient Position: Sitting, Cuff Size: Normal)   Pulse 91   Temp 98.5 F (36.9 C) (Oral)   Wt 227 lb 3.2 oz (103.1 kg)   LMP 11/25/2014   SpO2 97%   BMI 40.89 kg/m   Body mass index is 40.89 kg/m. WDWN in NAD  quiet respirations; very  congested  somewhat hoarse. Non toxic . HEENT: Normocephalic ;atraumatic ,  Eyes;  PERRL, EOMs  Full, lids and conjunctiva clear,,Ears: no deformities, canals nl, TM landmarks normal, Nose: no deformity or discharge but congested;face non  tender Mouth : OP clear without lesion or edema . Neck: Supple without adenopathy or masses or bruits Chest:  Good bs  Few exp wheeze musical clear with cough   CV:  S1-S2 no gallops or murmurs peripheral perfusion is normal Skin :nl perfusion and no acute rashes     BP Readings from Last 3 Encounters:  04/18/18 126/82  01/31/18 132/82  01/24/18 (!) 142/80    ASSESSMENT AND PLAN:  Discussed the following assessment and plan:  Viral respiratory infection - no obv complication  today    Wheezy bronchitis  Tobacco use disorder Not ready to quit tobacco  Counseled.    Expectant management. And fu .  -Patient advised to return or notify health care team  if  new concerns arise.  Patient Instructions  This is a viral infection head cold chest cold with a wheezy btonchitis that should resolve on its own as discussed .  Can try  Inhaler for wheezing cough med for comfort .  If fever   Worsening  Etc  Contact us for  Reevaluation   No eveidence of pneumonia on exam today.   Stopping tobacco  Can help your lungs     Neta MendsWanda K. Ingra Rother M.D.

## 2018-04-18 NOTE — Patient Instructions (Signed)
This is a viral infection head cold chest cold with a wheezy btonchitis that should resolve on its own as discussed .  Can try  Inhaler for wheezing cough med for comfort .  If fever   Worsening  Etc  Contact us for  Reevaluation   No eveidence of pneumonia on exam today.   Stopping tobacco  Can help your lungs

## 2018-04-20 DIAGNOSIS — M25561 Pain in right knee: Secondary | ICD-10-CM | POA: Diagnosis not present

## 2018-07-13 DIAGNOSIS — S83231A Complex tear of medial meniscus, current injury, right knee, initial encounter: Secondary | ICD-10-CM | POA: Diagnosis not present

## 2018-07-13 DIAGNOSIS — S83282A Other tear of lateral meniscus, current injury, left knee, initial encounter: Secondary | ICD-10-CM | POA: Diagnosis not present

## 2018-07-13 DIAGNOSIS — M659 Synovitis and tenosynovitis, unspecified: Secondary | ICD-10-CM | POA: Diagnosis not present

## 2018-07-13 DIAGNOSIS — G8918 Other acute postprocedural pain: Secondary | ICD-10-CM | POA: Diagnosis not present

## 2018-07-13 DIAGNOSIS — S83261A Peripheral tear of lateral meniscus, current injury, right knee, initial encounter: Secondary | ICD-10-CM | POA: Diagnosis not present

## 2018-07-13 DIAGNOSIS — M94261 Chondromalacia, right knee: Secondary | ICD-10-CM | POA: Diagnosis not present

## 2019-02-12 ENCOUNTER — Telehealth: Payer: BLUE CROSS/BLUE SHIELD | Admitting: Family

## 2019-02-12 DIAGNOSIS — B9789 Other viral agents as the cause of diseases classified elsewhere: Secondary | ICD-10-CM | POA: Diagnosis not present

## 2019-02-12 DIAGNOSIS — J069 Acute upper respiratory infection, unspecified: Secondary | ICD-10-CM

## 2019-02-12 MED ORDER — FLUTICASONE PROPIONATE 50 MCG/ACT NA SUSP: 2.0000 | Freq: Every day | NASAL | 6 refills | Status: DC

## 2019-02-12 MED ORDER — BENZONATATE 100 MG PO CAPS: 100.0000 mg | ORAL_CAPSULE | Freq: Three times a day (TID) | ORAL | 0 refills | Status: DC | PRN

## 2019-02-12 NOTE — Progress Notes (Signed)
We are sorry you are not feeling well.  Here is how we plan to help!  Based on what you have shared with me, it looks like you may have a viral upper respiratory infection or a "common cold".  Colds are caused by a large number of viruses; however, rhinovirus is the most common cause.   Symptoms of the common cold vary from person to person, with common symptoms including sore throat, cough, and malaise.  A low-grade fever of 100.4 may present, but is often uncommon.  Symptoms vary however, and are closely related to a person's age or underlying illnesses.  The most common symptoms associated with the common cold are nasal discharge or congestion, cough, sneezing, headache and pressure in the ears and face.  Cold symptoms usually persist for about 3 to 10 days, but can last up to 2 weeks.  It is important to know that colds do not cause serious illness or complications in most cases.     Approximately 5 minutes spent documenting and reviewing patient's chart.   The common cold is transmitted from person to person, with the most common method of transmission being a person's hands.  The virus is able to live on the skin and can infect other persons for up to 2 hours after direct contact.  Also, colds are transmitted when someone coughs or sneezes; thus, it is important to cover the mouth to reduce this risk.  To keep the spread of the common cold at bay, good hand hygiene is very important.  This is an infection that is most likely caused by a virus. There are no specific treatments for the common cold other than to help you with the symptoms until the infection runs its course.    For nasal congestion, you may use an oral decongestants such as Mucinex D or if you have glaucoma or high blood pressure use plain Mucinex.  Saline nasal spray or nasal drops can help and can safely be used as often as needed for congestion.  For your congestion, I have prescribed Fluticasone nasal spray one spray in each  nostril twice a day  If you do not have a history of heart disease, hypertension, diabetes or thyroid disease, prostate/bladder issues or glaucoma, you may also use Sudafed to treat nasal congestion.  It is highly recommended that you consult with a pharmacist or your primary care physician to ensure this medication is safe for you to take.     If you have a cough, you may use cough suppressants such as Delsym and Robitussin.  If you have glaucoma or high blood pressure, you can also use Coricidin HBP.   For cough I have prescribed for you A prescription cough medication called Tessalon Perles 100 mg. You may take 1-2 capsules every 8 hours as needed for cough  If you have a sore or scratchy throat, use a saltwater gargle-  to  teaspoon of salt dissolved in a 4-ounce to 8-ounce glass of warm water.  Gargle the solution for approximately 15-30 seconds and then spit.  It is important not to swallow the solution.  You can also use throat lozenges/cough drops and Chloraseptic spray to help with throat pain or discomfort.  Warm or cold liquids can also be helpful in relieving throat pain.  For headache, pain or general discomfort, you can use Ibuprofen or Tylenol as directed.   Some authorities believe that zinc sprays or the use of Echinacea may shorten the course of your symptoms.  HOME CARE . Only take medications as instructed by your medical team. . Be sure to drink plenty of fluids. Water is fine as well as fruit juices, sodas and electrolyte beverages. You may want to stay away from caffeine or alcohol. If you are nauseated, try taking small sips of liquids. How do you know if you are getting enough fluid? Your urine should be a pale yellow or almost colorless. . Get rest. . Taking a steamy shower or using a humidifier may help nasal congestion and ease sore throat pain. You can place a towel over your head and breathe in the steam from hot water coming from a faucet. . Using a saline nasal  spray works much the same way. . Cough drops, hard candies and sore throat lozenges may ease your cough. . Avoid close contacts especially the very young and the elderly . Cover your mouth if you cough or sneeze . Always remember to wash your hands.   GET HELP RIGHT AWAY IF: . You develop worsening fever. . If your symptoms do not improve within 10 days . You develop yellow or green discharge from your nose over 3 days. . You have coughing fits . You develop a severe head ache or visual changes. . You develop shortness of breath, difficulty breathing or start having chest pain . Your symptoms persist after you have completed your treatment plan  MAKE SURE YOU   Understand these instructions.  Will watch your condition.  Will get help right away if you are not doing well or get worse.  Your e-visit answers were reviewed by a board certified advanced clinical practitioner to complete your personal care plan. Depending upon the condition, your plan could have included both over the counter or prescription medications. Please review your pharmacy choice. If there is a problem, you may call our nursing hot line at and have the prescription routed to another pharmacy. Your safety is important to [place]. If you have drug allergies check your prescription carefully.   You can use MyChart to ask questions about today's visit, request a non-urgent call back, or ask for a work or school excuse for 24 hours related to this e-Visit. If it has been greater than 24 hours you will need to follow up with your provider, or enter a new e-Visit to address those concerns. You will get an e-mail in the next two days asking about your experience.  I hope that your e-visit has been valuable and will speed your recovery. Thank you for using e-visits.

## 2019-02-13 ENCOUNTER — Ambulatory Visit: Payer: BLUE CROSS/BLUE SHIELD | Admitting: Internal Medicine

## 2019-02-13 ENCOUNTER — Encounter: Payer: Self-pay | Admitting: Internal Medicine

## 2019-02-13 VITALS — BP 120/72 | HR 104 | Temp 98.5°F | Wt 235.3 lb

## 2019-02-13 DIAGNOSIS — F17200 Nicotine dependence, unspecified, uncomplicated: Secondary | ICD-10-CM | POA: Diagnosis not present

## 2019-02-13 DIAGNOSIS — R05 Cough: Secondary | ICD-10-CM | POA: Diagnosis not present

## 2019-02-13 DIAGNOSIS — J069 Acute upper respiratory infection, unspecified: Secondary | ICD-10-CM | POA: Diagnosis not present

## 2019-02-13 DIAGNOSIS — R059 Cough, unspecified: Secondary | ICD-10-CM

## 2019-02-13 LAB — POCT INFLUENZA A/B
INFLUENZA A, POC: NEGATIVE
INFLUENZA B, POC: NEGATIVE

## 2019-02-13 MED ORDER — GUAIFENESIN-CODEINE 100-10 MG/5ML PO SYRP: 5.0000 mL | ORAL_SOLUTION | Freq: Four times a day (QID) | ORAL | 0 refills | Status: DC | PRN

## 2019-02-13 MED ORDER — PROMETHAZINE-DM 6.25-15 MG/5ML PO SYRP: 2.5000 mL | ORAL_SOLUTION | Freq: Four times a day (QID) | ORAL | 0 refills | Status: DC | PRN

## 2019-02-13 NOTE — Patient Instructions (Addendum)
Your exam is reassuring and no signs of pneumonia  .  Comfort measures  Flu screen in negative . Cough may last 2 weeks but should be feeling better  And end of week weekend .

## 2019-02-13 NOTE — Progress Notes (Signed)
Chief Complaint  Patient presents with  . Cough    x3 days. mostly dry cough pt states "really deep that makes her lungs hurt. Pt has headaches and chills. Pt has not traveled internationally or been around someone that has in the last 21 days.     HPI: Susan Peters 53 y.o. come in for sda concern about pna    Had e visit yeasterday  And told she had viral uri with cough   OUT OF ALBUTEROL last visit was  4 19  Remote hx of pna nad bronchitisi  Onset   1-2 days ago deep cough  Takes breath away when coughing   and headache   Was well previously 1/2 ppd tobacco. No vaccination flu   Inhaler use seems to help some   Temp 98 99 .   Had cold in jan and went away on own  Now has this  Tessalon perles  Not helpful at this time ROS: See pertinent positives and negatives per HPI. No hemoptysis   But has mid chest soreness and pain when coughing   No pleurisy   Past Medical History:  Diagnosis Date  . Anemia   . Arthritis   . Back pain   . Hx of bacterial pneumonia   . Migraine    long standing 3-4 x per month takes med    Family History  Problem Relation Age of Onset  . Arthritis Mother   . Cancer Mother        Uterus  . Hyperlipidemia Mother   . Stroke Mother   . Heart disease Mother   . Hypertension Mother   . Diabetes Mother   . Hyperlipidemia Father   . Heart disease Father   . Hypertension Father   . Early death Father   . Cancer Maternal Aunt   . Hypertension Maternal Aunt   . Diabetes Maternal Aunt   . Heart disease Paternal Uncle   . Arthritis Maternal Grandmother   . Stroke Maternal Grandmother   . Hypertension Maternal Grandmother   . Arthritis Maternal Grandfather   . Heart disease Maternal Grandfather   . Hypertension Maternal Grandfather   . Arthritis Paternal Grandmother   . Heart disease Paternal Grandmother   . Hypertension Paternal Grandmother   . Diabetes Paternal Grandmother   . Arthritis Paternal Grandfather   . Heart disease Paternal Grandfather    . Stroke Paternal Grandfather   . Hypertension Paternal Grandfather   . Kidney disease Paternal Grandfather   . Cancer Maternal Aunt        Lung  . Diabetes Maternal Aunt   . Diabetes Cousin     Social History   Socioeconomic History  . Marital status: Single    Spouse name: Not on file  . Number of children: Not on file  . Years of education: Not on file  . Highest education level: Not on file  Occupational History  . Not on file  Social Needs  . Financial resource strain: Not on file  . Food insecurity:    Worry: Not on file    Inability: Not on file  . Transportation needs:    Medical: Not on file    Non-medical: Not on file  Tobacco Use  . Smoking status: Current Every Day Smoker    Packs/day: 0.50    Types: Cigarettes  . Smokeless tobacco: Never Used  Substance and Sexual Activity  . Alcohol use: Yes    Alcohol/week: 0.0 standard drinks  Comment: social  . Drug use: No  . Sexual activity: Not on file  Lifestyle  . Physical activity:    Days per week: Not on file    Minutes per session: Not on file  . Stress: Not on file  Relationships  . Social connections:    Talks on phone: Not on file    Gets together: Not on file    Attends religious service: Not on file    Active member of club or organization: Not on file    Attends meetings of clubs or organizations: Not on file    Relationship status: Not on file  Other Topics Concern  . Not on file  Social History Narrative   7-8 hours of sleep per night   Lives with her roommate   Works full time as a Education officer, 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 in the home roomates    G1P1  Lives with father and visits    Net fa pos tob etoh    [place]   [employer]  [place]     Outpatient Medications Prior to Visit  Medication Sig Dispense Refill  . albuterol (PROVENTIL HFA;VENTOLIN HFA) 108 (90 Base) MCG/ACT inhaler Inhale 2 puffs into the lungs every 6 (six) hours as  needed for wheezing or shortness of breath. 1 Inhaler 1  . benzonatate (TESSALON PERLES) 100 MG capsule Take 1 capsule (100 mg total) by mouth 3 (three) times daily as needed. 20 capsule 0  . fluticasone (FLONASE) 50 MCG/ACT nasal spray Place 2 sprays into both nostrils daily. 16 g 6  . promethazine-dextromethorphan (PROMETHAZINE-DM) 6.25-15 MG/5ML syrup Take 5 mLs by mouth 4 (four) times daily as needed for cough. At night (Patient not taking: Reported on 02/13/2019) 118 mL 0   No facility-administered medications prior to visit.      EXAM:  BP 120/72 (BP Location: Right Arm, Patient Position: Sitting, Cuff Size: Large)   Pulse (!) 104   Temp 98.5 F (36.9 C) (Oral)   Wt 235 lb 4.8 oz (106.7 kg)   LMP 11/25/2014   SpO2 96%   BMI 42.35 kg/m   Body mass index is 42.35 kg/m.  GENERAL: vitals reviewed and listed above, alert, oriented, appears well hydrated and in no acute distress midl hoarseness and dry wheezy cough   mildly ill non toxic  HEENT: atraumatic, conjunctiva  clear, no obvious abnormalities on inspection of external nose and ears tm clear  Face ntOP : no lesion edema or exudate  NECK: no obvious masses on inspection palpation  LUNGS: clear to auscultation bilaterally, no wheezes, rales or rhonchi, bs =   CV: HRRR, no clubbing cyanosis or  peripheral edema nl cap refill  MS: moves all extremities without noticeable focal  abnormality PSYCH: pleasant and cooperative, no obvious depression or anxiety  BP Readings from Last 3 Encounters:  02/13/19 120/72  04/18/18 126/82  01/31/18 132/82    ASSESSMENT AND PLAN:  Discussed the following assessment and plan:  Acute upper respiratory infection of multiple sites  Cough - Plan: POC Influenza A/B  Tobacco use disorder   Expectant management. No signs of pna today   At risk    Albuterol and cough suppression comfort  .  Pharmacy says out back order for promethazine    So sending in  r with codeine  To tell patient  For  comfort   Disc with pharmacy  Pt aware  Dc tobacco -Patient advised to return or notify health care team  if  new concerns arise.  Patient Instructions  Your exam is reassuring and no signs of pneumonia  .  Comfort measures  Flu screen in negative . Cough may last 2 weeks but should be feeling better  And end of week weekend .   Neta Mends. Miran Kautzman M.D.

## 2019-02-14 ENCOUNTER — Ambulatory Visit: Payer: Self-pay | Admitting: *Deleted

## 2019-02-14 NOTE — Telephone Encounter (Signed)
Pt was seen in office with Dr. Fabian Sharp on 02/13/19 and diagnosed with a upper respiratory infection. Pt states she now has a fever of 100.4 that was taken within a hour of calling the office. Pt states that around 3 am this morning her temp was 99.0 and last night her fever was 102.6. Pt states she has been taking 2- Tylenol 500 mg every 8 hours. Pt advised that she can continue to take Tylenol to treat fevers of 101 F and in between the time she could also alternate with ibuprofen 400 mg in addition to staying hydrated to control fevers. Pt states she was told to return call to office if she developed a fever with current symptoms. Pt can be contacted at [phone] with recommendations.   Reason for Disposition . [1] Fever AND [2] no signs of serious infection or localizing symptoms (all other triage questions negative)  Answer Assessment - Initial Assessment Questions 1. TEMPERATURE: "What is the most recent temperature?"  "How was it measured?"      100.4 2. ONSET: "When did the fever start?"      Started last night around 6 pm 3. SYMPTOMS: "Do you have any other symptoms besides the fever?"  (e.g., colds, headache, sore throat, earache, cough, rash, diarrhea, vomiting, abdominal pain)     Cough, pt was seen in office on 02/13/19 4. CAUSE: If there are no symptoms, ask: "What do you think is causing the fever?"      Diagnosed with upper resp infection on yesterday 5. CONTACTS: "Does anyone else in the family have an infection?"     n/a 6. TREATMENT: "What have you done so far to treat this fever?" (e.g., medications)     Pt has been taking Tylenol 2-500 mg tablets  Protocols used: FEVER-A-AH

## 2019-03-10 ENCOUNTER — Other Ambulatory Visit: Payer: Self-pay | Admitting: Internal Medicine

## 2019-07-05 ENCOUNTER — Other Ambulatory Visit: Payer: Self-pay

## 2019-07-05 ENCOUNTER — Encounter (HOSPITAL_COMMUNITY): Payer: Self-pay | Admitting: *Deleted

## 2019-07-05 ENCOUNTER — Emergency Department (HOSPITAL_COMMUNITY)
Admission: EM | Admit: 2019-07-05 | Discharge: 2019-07-06 | Disposition: A | Discharge status: Home / Self Care | Payer: BC Managed Care – PPO | Attending: Emergency Medicine | Admitting: Emergency Medicine

## 2019-07-05 ENCOUNTER — Emergency Department (HOSPITAL_COMMUNITY): Payer: BC Managed Care – PPO

## 2019-07-05 DIAGNOSIS — F17210 Nicotine dependence, cigarettes, uncomplicated: Secondary | ICD-10-CM | POA: Diagnosis not present

## 2019-07-05 DIAGNOSIS — R072 Precordial pain: Secondary | ICD-10-CM | POA: Diagnosis not present

## 2019-07-05 DIAGNOSIS — Z79899 Other long term (current) drug therapy: Secondary | ICD-10-CM | POA: Insufficient documentation

## 2019-07-05 DIAGNOSIS — R079 Chest pain, unspecified: Secondary | ICD-10-CM | POA: Diagnosis not present

## 2019-07-05 DIAGNOSIS — R05 Cough: Secondary | ICD-10-CM | POA: Diagnosis not present

## 2019-07-05 LAB — CBC
HCT: 42.7 % (ref 36.0–46.0)
Hemoglobin: 13.5 g/dL (ref 12.0–15.0)
MCH: 29 pg (ref 26.0–34.0)
MCHC: 31.6 g/dL (ref 30.0–36.0)
MCV: 91.8 fL (ref 80.0–100.0)
Platelets: 307 10*3/uL (ref 150–400)
RBC: 4.65 MIL/uL (ref 3.87–5.11)
RDW: 13.3 % (ref 11.5–15.5)
WBC: 15.7 10*3/uL — ABNORMAL HIGH (ref 4.0–10.5)
nRBC: 0 % (ref 0.0–0.2)

## 2019-07-05 LAB — BASIC METABOLIC PANEL
Anion gap: 9 (ref 5–15)
BUN: 13 mg/dL (ref 6–20)
CO2: 27 mmol/L (ref 22–32)
Calcium: 9.5 mg/dL (ref 8.9–10.3)
Chloride: 105 mmol/L (ref 98–111)
Creatinine, Ser: 0.83 mg/dL (ref 0.44–1.00)
GFR calc Af Amer: >60 mL/min (ref >60)
GFR calc non Af Amer: >60 mL/min (ref >60)
Glucose, Bld: 100 mg/dL — ABNORMAL HIGH (ref 70–99)
Potassium: 4.3 mmol/L (ref 3.5–5.1)
Sodium: 141 mmol/L (ref 135–145)

## 2019-07-05 LAB — TROPONIN I (HIGH SENSITIVITY)
Troponin I (High Sensitivity): 4 ng/L (ref <18)
Troponin I (High Sensitivity): 3 ng/L (ref <18)

## 2019-07-05 MED ORDER — SODIUM CHLORIDE 0.9% FLUSH: 3.0000 mL | Freq: Once | INTRAVENOUS | Status: DC

## 2019-07-05 NOTE — ED Triage Notes (Addendum)
Pt states L sided chest "flutter" that started this afternoon and increased to spasms that took her breath away.  Pt was at her desk when symptoms started.

## 2019-07-06 ENCOUNTER — Other Ambulatory Visit: Payer: Self-pay

## 2019-07-06 MED ORDER — ASPIRIN 81 MG PO CHEW
324.0000 mg | CHEWABLE_TABLET | Freq: Once | ORAL | Status: AC
  Administered 2019-07-06: 04:00:00 324 mg via ORAL
  Filled 2019-07-06: qty 4

## 2019-07-06 MED ORDER — ACETAMINOPHEN 325 MG PO TABS
650.0000 mg | ORAL_TABLET | Freq: Once | ORAL | Status: AC
  Administered 2019-07-06: 650 mg via ORAL
  Filled 2019-07-06: qty 2

## 2019-07-06 NOTE — ED Provider Notes (Signed)
[facility]   CSN: [id number] Arrival date & time: 07/05/19  1758     History   Chief Complaint Chief Complaint  Patient presents with  . Chest Pain    HPI Susan PontesSusan Y Peters is a 53 y.o. female.  HPI: A 53 year old patient with a history of obesity presents for evaluation of chest pain. Initial onset of pain was less than one hour ago. The patient's chest pain is described as heaviness/pressure/tightness and is not worse with exertion. The patient's chest pain is middle- or left-sided, is not well-localized, is not sharp and does not radiate to the arms/jaw/neck. The patient does not complain of nausea and denies diaphoresis. The patient has smoked in the past 90 days and has a family history of coronary artery disease in a first-degree relative with onset less than age 53. The patient has no history of stroke, has no history of peripheral artery disease, denies any history of treated diabetes, is not hypertensive and has no history of hypercholesterolemia.   Patient started having intermittent palpitations this afternoon.  Typically the palpitations resolve instantaneously, but today they have persisted throughout the evening and into the night.  Later on in the evening patient started having some chest tightness and spasms.  Pt has no hx of PE, DVT and denies any exogenous hormone (testosterone / estrogen) use, long distance travels or surgery in the past 6 weeks, active cancer, recent immobilization.   HPI  Past Medical History:  Diagnosis Date  . Anemia   . Arthritis   . Back pain   . Hx of bacterial pneumonia   . Migraine    long standing 3-4 x per month takes med    Patient Active Problem List   Diagnosis Date Noted  . Elevated alkaline phosphatase level 06/10/2015  . Sciatica 11/27/2014  . Tobacco use disorder 11/27/2014  . Frequent headaches 11/27/2014  . Family history of diabetes mellitus (DM) 11/27/2014  . Family  history of heart disease 11/27/2014    Past Surgical History:  Procedure Laterality Date  . KNEE SURGERY Left    1982  . SHOULDER SURGERY Left    1981     OB History    Gravida  1   Para  1   Term  1   Preterm      AB      Living        SAB      TAB      Ectopic      Multiple      Live Births               Home Medications    Prior to Admission medications   Medication Sig Start Date End Date Taking? Authorizing Provider  benzonatate (TESSALON PERLES) 100 MG capsule Take 1 capsule (100 mg total) by mouth 3 (three) times daily as needed. 02/12/19   Junie SpencerHawks, Christy A, FNP  fluticasone (FLONASE) 50 MCG/ACT nasal spray Place 2 sprays into both nostrils daily. 02/12/19   Hawks, Neysa Bonitohristy A, FNP  guaiFENesin-codeine (ROBITUSSIN AC) 100-10 MG/5ML syrup Take 5 mLs by mouth 4 (four) times daily as needed for cough. At  night 02/13/19   Panosh, Neta MendsWanda K, MD  PROAIR HFA 108 [phone](90 Base) MCG/ACT inhaler INHALE 2 PUFFS INTO THE LUNGS EVERY 6 HOURS AS NEEDED FOR WHEEZE OR SHORTNESS OF BREATH 03/10/19   Panosh, Neta MendsWanda K, MD  promethazine-dextromethorphan (PROMETHAZINE-DM) 6.25-15 MG/5ML syrup Take 5 mLs by mouth  4 (four) times daily as needed for cough. At night Patient not taking: Reported on 02/13/2019 04/18/18   Madelin HeadingsPanosh, Wanda K, MD  promethazine-dextromethorphan (PROMETHAZINE-DM) 6.25-15 MG/5ML syrup Take 2.5-5 mLs by mouth 4 (four) times daily as needed for cough. At night 02/13/19   Panosh, Neta MendsWanda K, MD    Family History Family History  Problem Relation Age of Onset  . Arthritis Mother   . Cancer Mother        Uterus  . Hyperlipidemia Mother   . Stroke Mother   . Heart disease Mother   . Hypertension Mother   . Diabetes Mother   . Hyperlipidemia Father   . Heart disease Father   . Hypertension Father   . Early death Father   . Cancer Maternal Aunt   . Hypertension Maternal Aunt   . Diabetes Maternal Aunt   . Heart disease Paternal Uncle   . Cancer Maternal Aunt         Lung  . Diabetes Maternal Aunt   . Diabetes Cousin   . Arthritis Maternal Grandmother   . Stroke Maternal Grandmother   . Hypertension Maternal Grandmother   . Arthritis Maternal Grandfather   . Heart disease Maternal Grandfather   . Hypertension Maternal Grandfather   . Arthritis Paternal Grandmother   . Heart disease Paternal Grandmother   . Hypertension Paternal Grandmother   . Diabetes Paternal Grandmother   . Arthritis Paternal Grandfather   . Heart disease Paternal Grandfather   . Stroke Paternal Grandfather   . Hypertension Paternal Grandfather   . Kidney disease Paternal Grandfather     Social History Social History   Tobacco Use  . Smoking status: Current Every Day Smoker    Packs/day: 0.50    Types: Cigarettes  . Smokeless tobacco: Never Used  Substance Use Topics  . Alcohol use: Yes    Alcohol/week: 0.0 standard drinks    Comment: social  . Drug use: No     Allergies   Patient has no known allergies.   Review of Systems Review of Systems  Constitutional: Positive for activity change. Negative for diaphoresis.  Respiratory: Positive for shortness of breath.   Cardiovascular: Positive for chest pain and palpitations.  Gastrointestinal: Negative for nausea and vomiting.  Allergic/Immunologic: Negative for immunocompromised state.  Hematological: Does not bruise/bleed easily.  All other systems reviewed and are negative.    Physical Exam Updated Vital Signs BP 119/80 (BP Location: Right Arm)   Pulse 74   Temp 97.9 F (36.6 C) (Oral)   Resp 16   Ht 5\' 3"  (1.6 m)   Wt 106.7 kg   LMP 11/25/2014   SpO2 100%   BMI 41.67 kg/m   Physical Exam Vitals signs and nursing note reviewed.  Constitutional:      Appearance: She is well-developed.  HENT:     Head: Normocephalic and atraumatic.  Neck:     Musculoskeletal: Normal range of motion and neck supple.  Cardiovascular:     Rate and Rhythm: Normal rate.  Pulmonary:     Effort: Pulmonary effort  is normal.     Breath sounds: Normal breath sounds. No decreased breath sounds.  Abdominal:     General: Bowel sounds are normal.  Skin:    General: Skin is warm and dry.  Neurological:     Mental Status: She is alert and oriented to person, place, and time.      ED Treatments / Results  Labs (all labs ordered are listed, but only  abnormal results are displayed) Labs Reviewed  BASIC METABOLIC PANEL - Abnormal; Notable for the following components:      Result Value   Glucose, Bld 100 (*)    All other components within normal limits  CBC - Abnormal; Notable for the following components:   WBC 15.7 (*)    All other components within normal limits  TROPONIN I (HIGH SENSITIVITY)  TROPONIN I (HIGH SENSITIVITY)    EKG EKG Interpretation  Date/Time:  Wednesday July 05 2019 18:08:18 EDT Ventricular Rate:  93 PR Interval:  128 QRS Duration: 82 QT Interval:  374 QTC Calculation: 465 R Axis:   5 Text Interpretation:  Normal sinus rhythm Moderate voltage criteria for LVH, may be normal variant Borderline ECG No acute changes [id number] is new Confirmed by Varney Biles [phone]) on 07/06/2019 3:11:55 AM   Radiology Dg Chest 2 View  Result Date: 07/05/2019 CLINICAL DATA:  Chest pain, primarily in the sternum region.  Cough. EXAM: CHEST - 2 VIEW COMPARISON:  January 31, 2018 FINDINGS: Lungs are clear. Heart size and pulmonary vascularity are normal. No adenopathy. There is degenerative change in the thoracic spine. No lesion seen in the region of the sternum. No pneumothorax. IMPRESSION: No edema or consolidation.  No pneumothorax.  No adenopathy. Electronically Signed   By: Lowella Grip III M.D.   On: 07/05/2019 19:55    Procedures Procedures (including critical care time)  Medications Ordered in ED Medications  sodium chloride flush (NS) 0.9 % injection 3 mL (has no administration in time range)  aspirin chewable tablet 324 mg (has no administration in time range)   acetaminophen (TYLENOL) tablet 650 mg (has no administration in time range)     Initial Impression / Assessment and Plan / ED Course  I have reviewed the triage vital signs and the nursing notes.  Pertinent labs & imaging results that were available during my care of the patient were reviewed by me and considered in my medical decision making (see chart for details).     HEAR Score: 5  Patient comes in a chief complaint of chest pain. She started having palpitations which she has had in the past, however instead of being self-limiting they would persisted.  On our telemetry monitoring we have not picked up any concerning arrhythmia.  During my evaluation patient was in sinus rhythm.  She is also states that later in the evening she started having left-sided chest spasms and tightness.  Those symptoms are also intermittent and there is associated shortness of breath.  She does smoke and has family history that is concerning for premature CAD.  With a hear score of 5 and delta troponin which is negative, we essentially of rule out ACS in the ED.  Given that she has premature CAD in the family along with nonspecific complaints of palpitations along with new leg swelling -we have advised her to follow-up with her PCP, but also given cardiology follow-up information if needed.  Final Clinical Impressions(s) / ED Diagnoses   Final diagnoses:  Precordial chest pain    ED Discharge Orders    None       Varney Biles, MD 07/06/19 [phone]

## 2019-07-06 NOTE — Discharge Instructions (Addendum)
We saw you in the ER for the chest pain/shortness of breath. All of our cardiac workup is normal, including labs, EKG and chest X-RAY are normal. We are not sure what is causing your discomfort, but we feel comfortable sending you home at this time. The workup in the ER is not complete, and you should follow up with your primary care doctor or a cardiologist for further evaluation.  Please return to the ER if you have worsening chest pain, shortness of breath, pain radiating to your jaw, shoulder, or back, sweats or fainting. Otherwise see the Cardiologist or your primary care doctor as requested.  Start taking baby aspirin daily and refrain from smoking.

## 2019-07-06 NOTE — ED Notes (Signed)
Pt discharged from ED; instructions provided; Pt encouraged to return to ED if symptoms worsen and to f/u with PCP; Pt verbalized understanding of all instructions 

## 2019-07-07 ENCOUNTER — Telehealth: Payer: Self-pay | Admitting: *Deleted

## 2019-07-07 DIAGNOSIS — R002 Palpitations: Secondary | ICD-10-CM | POA: Diagnosis not present

## 2019-07-07 DIAGNOSIS — E785 Hyperlipidemia, unspecified: Secondary | ICD-10-CM | POA: Diagnosis not present

## 2019-07-07 DIAGNOSIS — R079 Chest pain, unspecified: Secondary | ICD-10-CM | POA: Diagnosis not present

## 2019-07-07 DIAGNOSIS — Z20822 Contact with and (suspected) exposure to covid-19: Secondary | ICD-10-CM

## 2019-07-07 DIAGNOSIS — R0602 Shortness of breath: Secondary | ICD-10-CM | POA: Diagnosis not present

## 2019-07-07 NOTE — Telephone Encounter (Signed)
I advise against antibiotic   At this time based on     Risk more than help howeever  If you feel you are having a resp illness suggest we set you up for a covid test   If she agrees  [facility] can you send order for testing and then we can do a vido or phone visit in follow up .   Elevated WBC can be seen in any acute illness or if tobacco etc

## 2019-07-07 NOTE — Telephone Encounter (Signed)
Attempted to contact pt to schedule COVID testing per Dr Regis Bill; left message on voicemail for pt to call [phone] 7a-7p Monday thru Friday; orders placed per protocol.

## 2019-07-18 DIAGNOSIS — R002 Palpitations: Secondary | ICD-10-CM | POA: Diagnosis not present

## 2019-07-18 DIAGNOSIS — R079 Chest pain, unspecified: Secondary | ICD-10-CM | POA: Diagnosis not present

## 2019-07-26 DIAGNOSIS — R002 Palpitations: Secondary | ICD-10-CM | POA: Diagnosis not present

## 2019-07-26 DIAGNOSIS — F17200 Nicotine dependence, unspecified, uncomplicated: Secondary | ICD-10-CM | POA: Diagnosis not present

## 2019-07-26 DIAGNOSIS — E785 Hyperlipidemia, unspecified: Secondary | ICD-10-CM | POA: Diagnosis not present

## 2019-07-26 DIAGNOSIS — R079 Chest pain, unspecified: Secondary | ICD-10-CM | POA: Diagnosis not present

## 2019-08-28 ENCOUNTER — Ambulatory Visit: Payer: BC Managed Care – PPO | Admitting: Cardiology

## 2019-09-01 ENCOUNTER — Ambulatory Visit: Payer: BC Managed Care – PPO | Admitting: Interventional Cardiology

## 2019-09-12 DIAGNOSIS — M19011 Primary osteoarthritis, right shoulder: Secondary | ICD-10-CM | POA: Diagnosis not present

## 2019-09-12 DIAGNOSIS — M25511 Pain in right shoulder: Secondary | ICD-10-CM | POA: Diagnosis not present

## 2019-12-06 DIAGNOSIS — Z20828 Contact with and (suspected) exposure to other viral communicable diseases: Secondary | ICD-10-CM | POA: Diagnosis not present

## 2019-12-06 DIAGNOSIS — Z9189 Other specified personal risk factors, not elsewhere classified: Secondary | ICD-10-CM | POA: Diagnosis not present

## 2019-12-29 HISTORY — PX: KNEE SURGERY: SHX244

## 2020-01-08 DIAGNOSIS — Z20822 Contact with and (suspected) exposure to covid-19: Secondary | ICD-10-CM | POA: Diagnosis not present

## 2020-06-17 DIAGNOSIS — M25561 Pain in right knee: Secondary | ICD-10-CM | POA: Diagnosis not present

## 2020-10-08 IMAGING — CR CHEST - 2 VIEW
2 series · 2 of 2 positions shown · non-contrast
Comparison: January 31, 2018

CLINICAL DATA: Chest pain, primarily in the sternum region.  Cough.

EXAM:
CHEST - 2 VIEW

[chest pa]
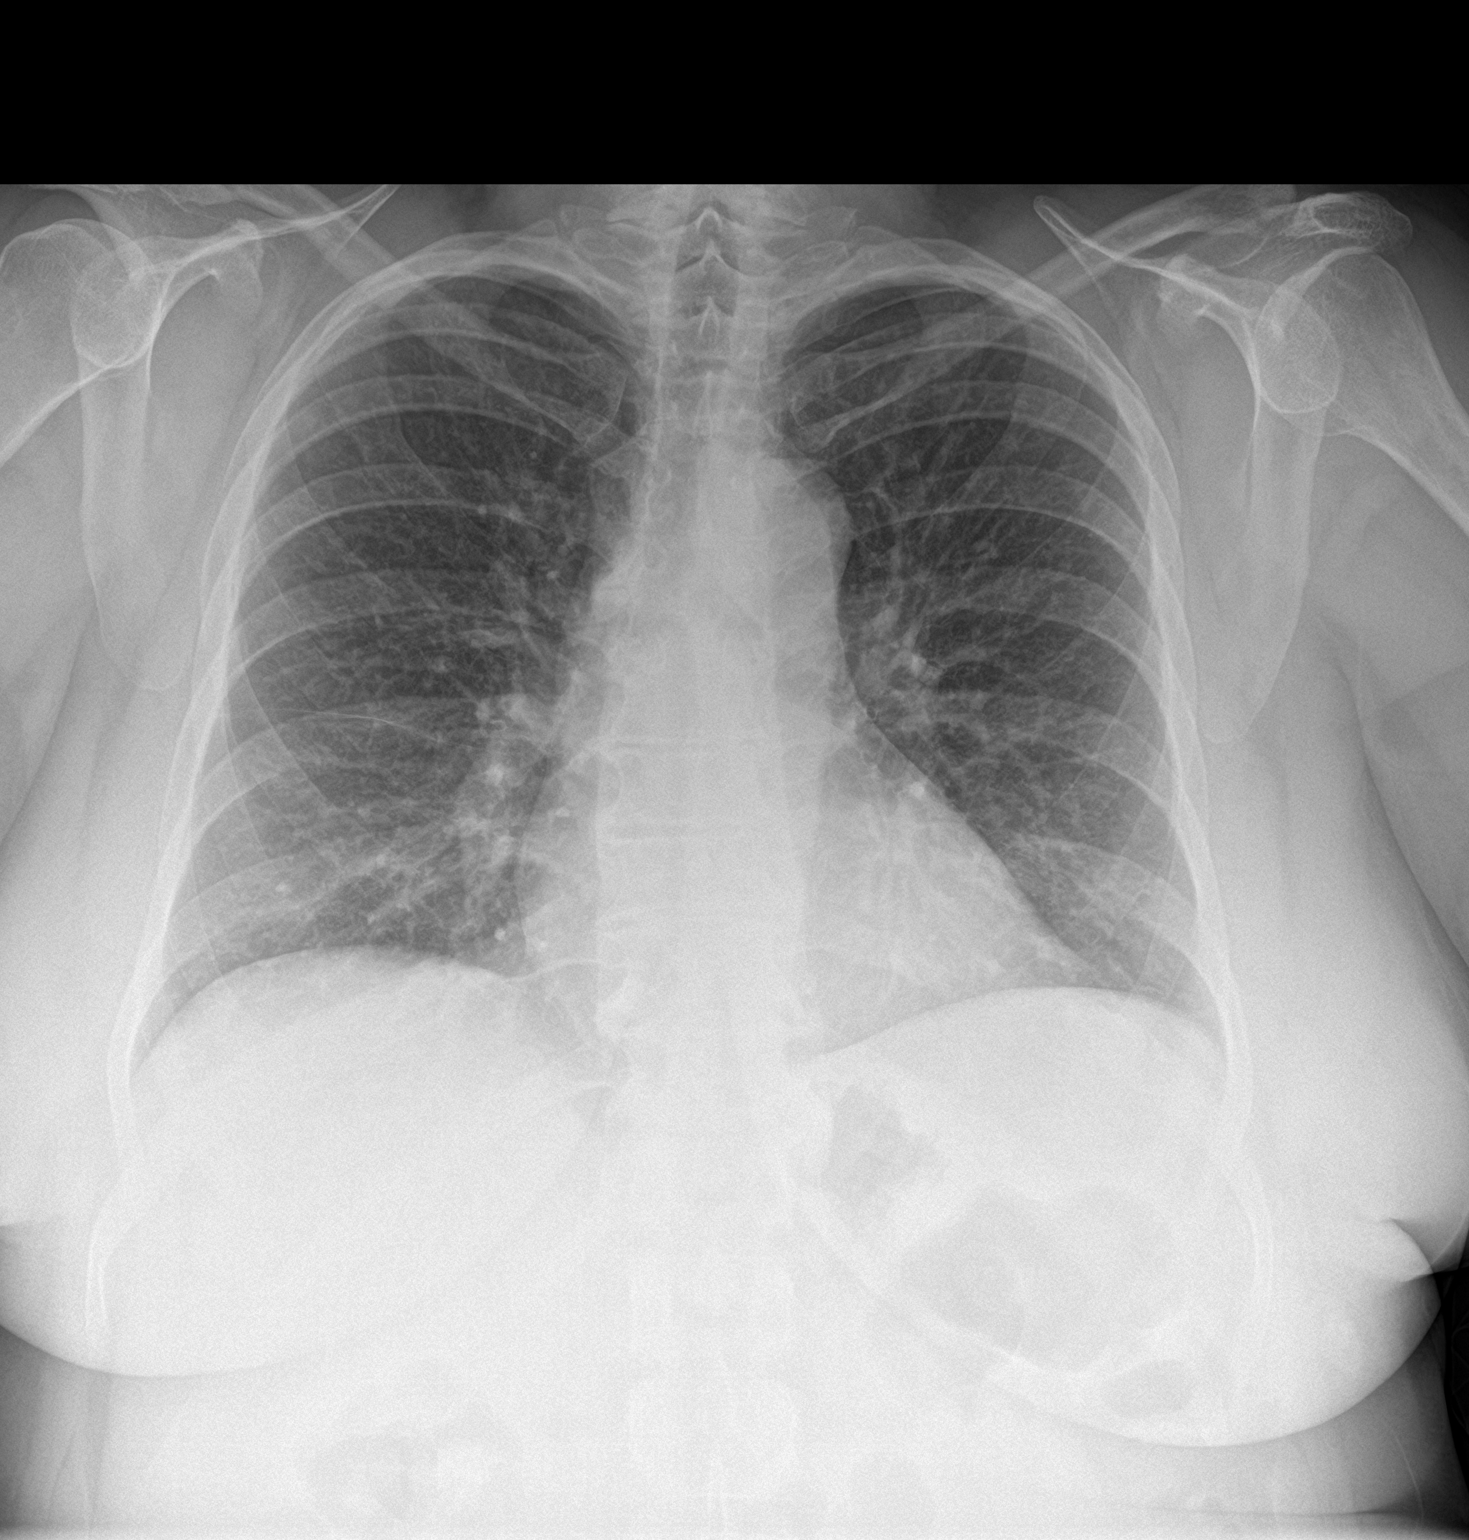

[chest lat]
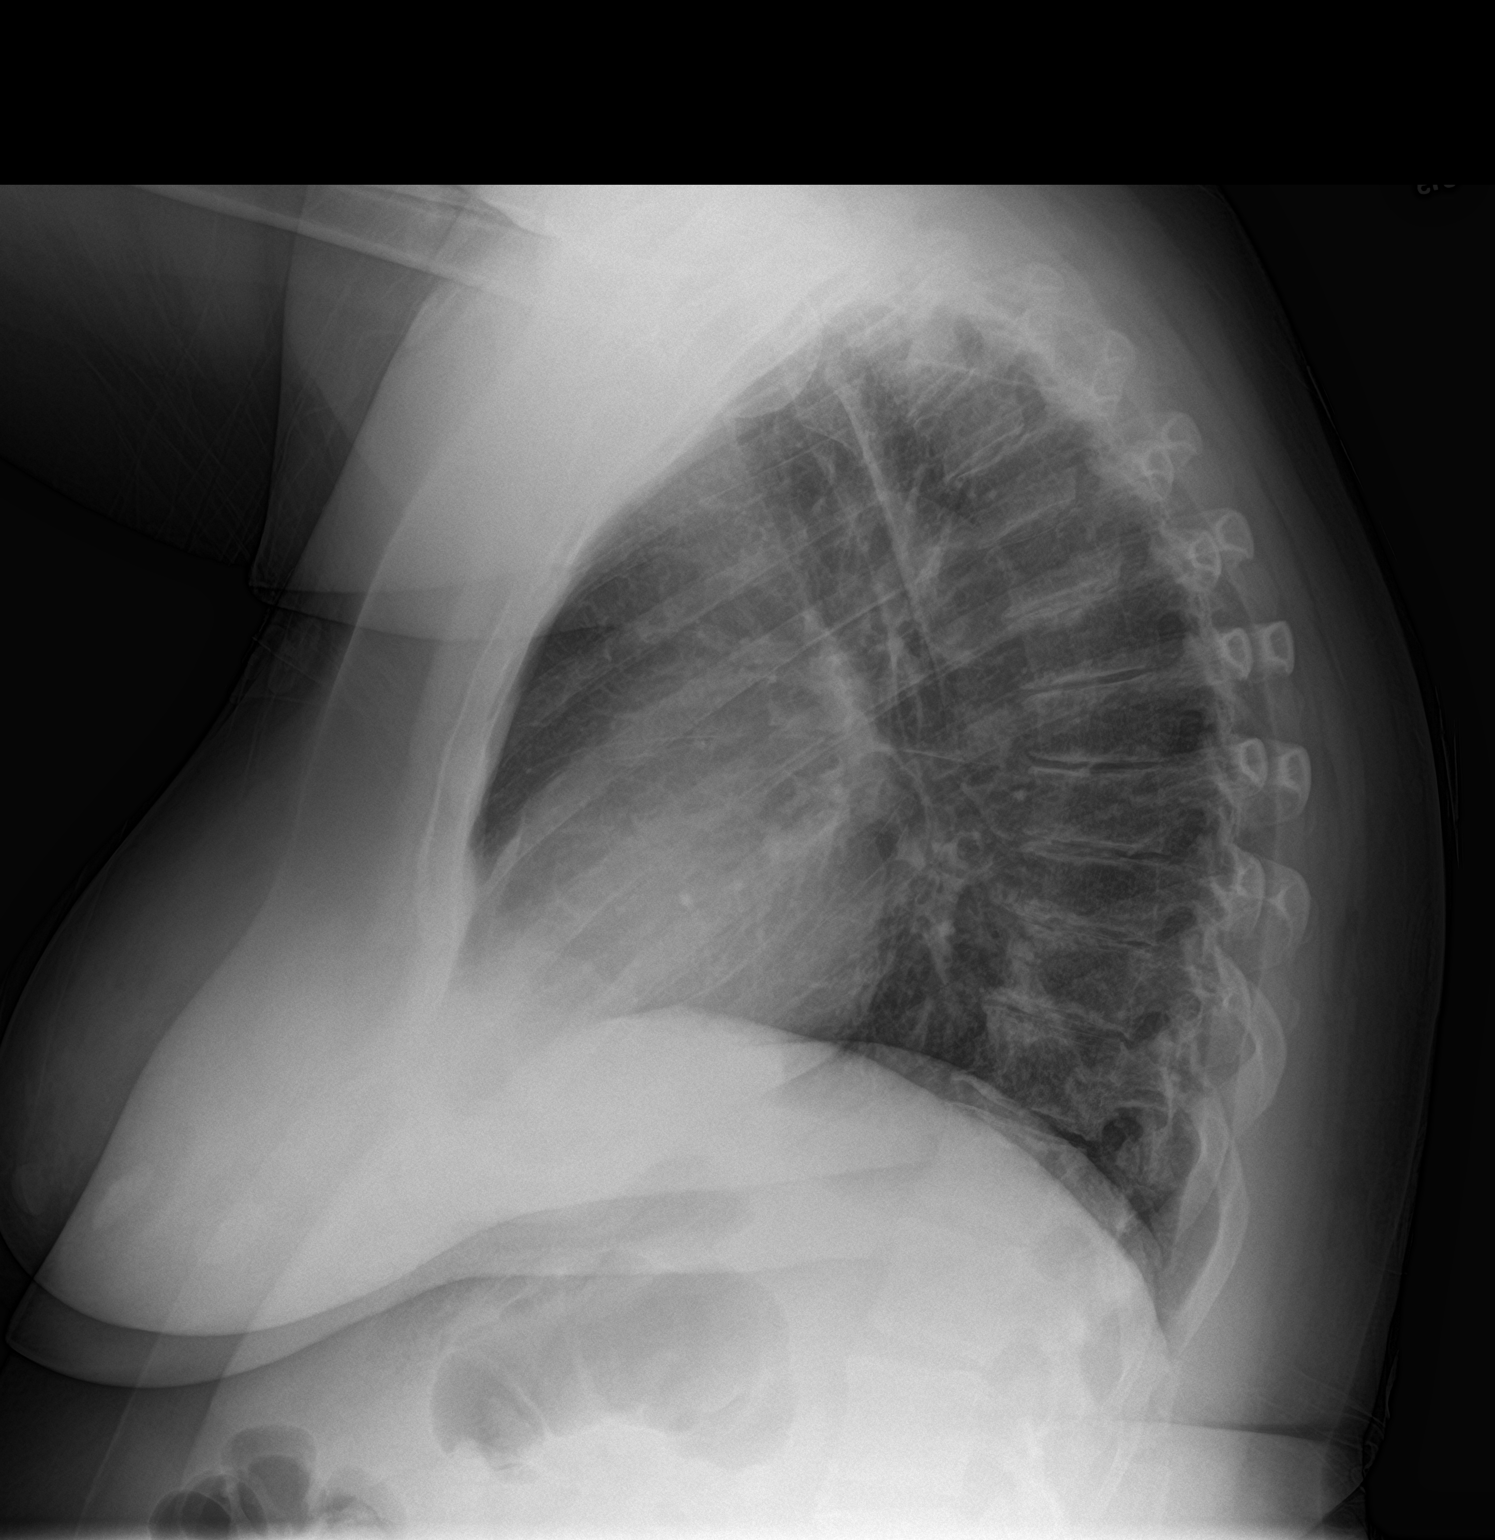

[2 of 2 positions shown; findings below may reference images not displayed]

FINDINGS: Lungs are clear. Heart size and pulmonary vascularity are normal. No
adenopathy. There is degenerative change in the thoracic spine. No
lesion seen in the region of the sternum. No pneumothorax.
IMPRESSION: No edema or consolidation.  No pneumothorax.  No adenopathy.

## 2020-12-11 ENCOUNTER — Encounter: Payer: Self-pay | Admitting: Internal Medicine

## 2021-01-03 ENCOUNTER — Encounter: Payer: BC Managed Care – PPO | Admitting: Internal Medicine

## 2021-01-06 NOTE — Progress Notes (Deleted)
No chief complaint on file.   HPI: Patient  Susan Peters  55 y.o. comes in today for Preventive Health Care visit   Health Maintenance  Topic Date Due  . Hepatitis C Screening  Never done  . COVID-19 Vaccine (1) Never done  . HIV Screening  Never done  . MAMMOGRAM  Never done  . PAP SMEAR-Modifier  11/15/2017  . INFLUENZA VACCINE  07/28/2020  . TETANUS/TDAP  11/27/2024  . COLONOSCOPY (Pts 45-32yrs Insurance coverage will need to be confirmed)  09/10/2027   Health Maintenance Review LIFESTYLE:  Exercise:   Tobacco/ETS: Alcohol:  Sugar beverages: Sleep: Drug use: no HH of  Work:    ROS:  GEN/ HEENT: No fever, significant weight changes sweats headaches vision problems hearing changes, CV/ PULM; No chest pain shortness of breath cough, syncope,edema  change in exercise tolerance. GI /GU: No adominal pain, vomiting, change in bowel habits. No blood in the stool. No significant GU symptoms. SKIN/HEME: ,no acute skin rashes suspicious lesions or bleeding. No lymphadenopathy, nodules, masses.  NEURO/ PSYCH:  No neurologic signs such as weakness numbness. No depression anxiety. IMM/ Allergy: No unusual infections.  Allergy .   REST of 12 system review negative except as per HPI   Past Medical History:  Diagnosis Date  . Anemia   . Arthritis   . Back pain   . Hx of bacterial pneumonia   . Migraine    long standing 3-4 x per month takes med    Past Surgical History:  Procedure Laterality Date  . KNEE SURGERY Left    1982  . SHOULDER SURGERY Left    1981    Family History  Problem Relation Age of Onset  . Arthritis Mother   . Cancer Mother        Uterus  . Hyperlipidemia Mother   . Stroke Mother   . Heart disease Mother   . Hypertension Mother   . Diabetes Mother   . Hyperlipidemia Father   . Heart disease Father   . Hypertension Father   . Early death Father   . Cancer Maternal Aunt   . Hypertension Maternal Aunt   . Diabetes Maternal Aunt   . Heart  disease Paternal Uncle   . Cancer Maternal Aunt        Lung  . Diabetes Maternal Aunt   . Diabetes Cousin   . Arthritis Maternal Grandmother   . Stroke Maternal Grandmother   . Hypertension Maternal Grandmother   . Arthritis Maternal Grandfather   . Heart disease Maternal Grandfather   . Hypertension Maternal Grandfather   . Arthritis Paternal Grandmother   . Heart disease Paternal Grandmother   . Hypertension Paternal Grandmother   . Diabetes Paternal Grandmother   . Arthritis Paternal Grandfather   . Heart disease Paternal Grandfather   . Stroke Paternal Grandfather   . Hypertension Paternal Grandfather   . Kidney disease Paternal Grandfather     Social History   Socioeconomic History  . Marital status: Single    Spouse name: Not on file  . Number of children: Not on file  . Years of education: Not on file  . Highest education level: Not on file  Occupational History  . Not on file  Tobacco Use  . Smoking status: Current Every Day Smoker    Packs/day: 0.50    Types: Cigarettes  . Smokeless tobacco: Never Used  Vaping Use  . Vaping Use: Never used  Substance and Sexual Activity  .  Alcohol use: Yes    Alcohol/week: 0.0 standard drinks    Comment: social  . Drug use: No  . Sexual activity: Not on file  Other Topics Concern  . 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 benzonatate  (TESSALON PERLES) 100 MG capsule Take 1 capsule (100 mg total) by mouth 3 (three) times daily as needed. 20 capsule 0  . fluticasone (FLONASE) 50 MCG/ACT nasal spray Place 2 sprays into both nostrils daily. 16 g 6  . guaiFENesin-codeine (ROBITUSSIN AC) 100-10 MG/5ML syrup Take 5 mLs by mouth 4 (four) times daily as needed for cough. At  night 120 mL 0  . PROAIR HFA 108 (90 Base) MCG/ACT inhaler INHALE 2 PUFFS INTO THE LUNGS EVERY 6 HOURS AS NEEDED FOR WHEEZE OR SHORTNESS OF BREATH 8.5 Inhaler 1  . promethazine-dextromethorphan (PROMETHAZINE-DM) 6.25-15 MG/5ML syrup Take 5 mLs by mouth 4 (four) times daily as needed for cough. At night (Patient not taking: Reported on 02/13/2019) 118 mL 0  . promethazine-dextromethorphan (PROMETHAZINE-DM) 6.25-15 MG/5ML syrup Take 2.5-5 mLs by mouth 4 (four) times daily as needed for cough. At night 118 mL 0   No facility-administered medications prior to visit.     EXAM:  LMP 11/25/2014   There is no height or weight on file to calculate BMI. Wt Readings from Last 3 Encounters:  07/05/19 235 lb 3.7 oz (106.7 kg)  02/13/19 235 lb 4.8 oz (106.7 kg)  04/18/18 227 lb 3.2 oz (103.1 kg)    Physical Exam: Vital signs reviewed MVH:QION is a well-developed well-nourished alert cooperative    who appearsr stated age in no acute distress.  HEENT: normocephalic atraumatic , Eyes: PERRL EOM's full, conjunctiva clear, Nares: paten,t no deformity discharge or tenderness., Ears: no deformity EAC's clear TMs with normal landmarks. Mouth: clear OP, no lesions, edema.  Moist mucous membranes. Dentition in adequate repair. NECK: supple without masses, thyromegaly or bruits. CHEST/PULM:  Clear to auscultation and percussion breath sounds equal no wheeze , rales or rhonchi. No chest wall deformities or tenderness. Breast: normal by inspection . No dimpling, discharge, masses, tenderness or discharge . CV: PMI is nondisplaced, S1 S2 no gallops, murmurs, rubs. Peripheral pulses  are full without delay.No JVD .  ABDOMEN: Bowel sounds normal nontender  No guard or rebound, no hepato splenomegal no CVA tenderness.  No hernia. Extremtities:  No clubbing cyanosis or edema, no acute joint swelling or redness no focal atrophy NEURO:  Oriented x3, cranial nerves 3-12 appear to be intact, no obvious focal weakness,gait within normal limits no abnormal reflexes or asymmetrical SKIN: No acute rashes normal turgor, color, no bruising or petechiae. PSYCH: Oriented, good eye contact, no obvious depression anxiety, cognition and judgment appear normal. LN: no cervical axillary  inguinal adenopathy  Lab Results  Component Value Date   WBC 15.7 (H) 07/05/2019   HGB 13.5 07/05/2019   HCT 42.7 07/05/2019   PLT 307 07/05/2019   GLUCOSE 100 (H) 07/05/2019   CHOL 199 06/03/2015   TRIG 97.0 06/03/2015   HDL 36.60 (L) 06/03/2015   LDLCALC 143 (H) 06/03/2015   ALT 17 06/03/2015   AST 14 06/03/2015   NA 141 07/05/2019   K 4.3 07/05/2019   CL 105 07/05/2019   CREATININE 0.83 07/05/2019   BUN 13 07/05/2019   CO2 27 07/05/2019   TSH 3.64 06/03/2015   HGBA1C 5.9 07/08/2015    BP Readings from Last 3 Encounters:  07/06/19 131/79  02/13/19 120/72  04/18/18 126/82    Lab results reviewed with patient   ASSESSMENT AND PLAN:  Discussed the following assessment and plan:  No diagnosis found. No follow-ups on file.  Patient Care Team: Jacquis Paxton, Neta Mends, MD as PCP - General (Internal Medicine) Harold Hedge, MD as Consulting Physician (Obstetrics and Gynecology) There are no Patient Instructions on file for this visit.  Neta Mends. Jaqueline Uber M.D.

## 2021-01-07 ENCOUNTER — Encounter: Payer: BC Managed Care – PPO | Admitting: Internal Medicine

## 2021-01-07 DIAGNOSIS — Z20822 Contact with and (suspected) exposure to covid-19: Secondary | ICD-10-CM | POA: Diagnosis not present

## 2021-01-07 DIAGNOSIS — Z0000 Encounter for general adult medical examination without abnormal findings: Secondary | ICD-10-CM

## 2021-01-24 LAB — GLUCOSE, POCT (MANUAL RESULT ENTRY): POC Glucose: 145 mg/dl — AB (ref 70–99)

## 2021-02-04 NOTE — Progress Notes (Deleted)
No chief complaint on file.   HPI: Patient  Susan Peters  55 y.o. comes in today for Preventive Health Care visit   Last visit  Wit2 2020  Health Maintenance  Topic Date Due  . Hepatitis C Screening  Never done  . COVID-19 Vaccine (1) Never done  . HIV Screening  Never done  . MAMMOGRAM  Never done  . PAP SMEAR-Modifier  11/15/2017  . INFLUENZA VACCINE  07/28/2020  . TETANUS/TDAP  11/27/2024  . COLONOSCOPY (Pts 45-71yrs Insurance coverage will need to be confirmed)  09/10/2027   Health Maintenance Review LIFESTYLE:  Exercise:   Tobacco/ETS: Alcohol:  Sugar beverages: Sleep: Drug use: no HH of  Work:    ROS:  GEN/ HEENT: No fever, significant weight changes sweats headaches vision problems hearing changes, CV/ PULM; No chest pain shortness of breath cough, syncope,edema  change in exercise tolerance. GI /GU: No adominal pain, vomiting, change in bowel habits. No blood in the stool. No significant GU symptoms. SKIN/HEME: ,no acute skin rashes suspicious lesions or bleeding. No lymphadenopathy, nodules, masses.  NEURO/ PSYCH:  No neurologic signs such as weakness numbness. No depression anxiety. IMM/ Allergy: No unusual infections.  Allergy .   REST of 12 system review negative except as per HPI   Past Medical History:  Diagnosis Date  . Anemia   . Arthritis   . Back pain   . Hx of bacterial pneumonia   . Migraine    long standing 3-4 x per month takes med    Past Surgical History:  Procedure Laterality Date  . KNEE SURGERY Left    1982  . SHOULDER SURGERY Left    1981    Family History  Problem Relation Age of Onset  . Arthritis Mother   . Cancer Mother        Uterus  . Hyperlipidemia Mother   . Stroke Mother   . Heart disease Mother   . Hypertension Mother   . Diabetes Mother   . Hyperlipidemia Father   . Heart disease Father   . Hypertension Father   . Early death Father   . Cancer Maternal Aunt   . Hypertension Maternal Aunt   . Diabetes  Maternal Aunt   . Heart disease Paternal Uncle   . Cancer Maternal Aunt        Lung  . Diabetes Maternal Aunt   . Diabetes Cousin   . Arthritis Maternal Grandmother   . Stroke Maternal Grandmother   . Hypertension Maternal Grandmother   . Arthritis Maternal Grandfather   . Heart disease Maternal Grandfather   . Hypertension Maternal Grandfather   . Arthritis Paternal Grandmother   . Heart disease Paternal Grandmother   . Hypertension Paternal Grandmother   . Diabetes Paternal Grandmother   . Arthritis Paternal Grandfather   . Heart disease Paternal Grandfather   . Stroke Paternal Grandfather   . Hypertension Paternal Grandfather   . Kidney disease Paternal Grandfather     Social History   Socioeconomic History  . Marital status: Single    Spouse name: Not on file  . Number of children: Not on file  . Years of education: Not on file  . Highest education level: Not on file  Occupational History  . Not on file  Tobacco Use  . Smoking status: Current Every Day Smoker    Packs/day: 0.50    Types: Cigarettes  . Smokeless tobacco: Never Used  Vaping Use  . Vaping Use: Never used  Substance and Sexual Activity  . Alcohol use: Yes    Alcohol/week: 0.0 standard drinks    Comment: social  . Drug use: No  . Sexual activity: Not on file  Other Topics Concern  . 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 benzonatate (TESSALON PERLES) 100 MG capsule Take 1 capsule (100 mg total) by mouth 3 (three) times daily as needed. 20 capsule 0  . fluticasone (FLONASE) 50 MCG/ACT nasal spray Place 2 sprays into both nostrils daily. 16 g 6  . guaiFENesin-codeine (ROBITUSSIN AC) 100-10 MG/5ML syrup Take 5 mLs by mouth 4 (four) times daily as needed for cough. At  night 120 mL 0  . PROAIR HFA 108 (90 Base) MCG/ACT inhaler INHALE 2 PUFFS INTO THE LUNGS EVERY 6 HOURS AS NEEDED FOR WHEEZE OR SHORTNESS OF BREATH 8.5 Inhaler 1  . promethazine-dextromethorphan (PROMETHAZINE-DM) 6.25-15 MG/5ML syrup Take 5 mLs by mouth 4 (four) times daily as needed for cough. At night (Patient not taking: Reported on 02/13/2019) 118 mL 0  . promethazine-dextromethorphan (PROMETHAZINE-DM) 6.25-15 MG/5ML syrup Take 2.5-5 mLs by mouth 4 (four) times daily as needed for cough. At night 118 mL 0   No facility-administered medications prior to visit.     EXAM:  LMP 11/25/2014   There is no height or weight on file to calculate BMI. Wt Readings from Last 3 Encounters:  07/05/19 235 lb 3.7 oz (106.7 kg)  02/13/19 235 lb 4.8 oz (106.7 kg)  04/18/18 227 lb 3.2 oz (103.1 kg)    Physical Exam: Vital signs reviewed CBS:WHQP is a well-developed well-nourished alert cooperative    who appearsr stated age in no acute distress.  HEENT: normocephalic atraumatic , Eyes: PERRL EOM's full, conjunctiva clear, Nares: paten,t no deformity discharge or tenderness., Ears: no deformity EAC's clear TMs with normal landmarks. Mouth: clear OP, no lesions, edema.  Moist mucous membranes. Dentition in adequate repair. NECK: supple without masses, thyromegaly or bruits. CHEST/PULM:  Clear to auscultation and percussion breath sounds equal no wheeze , rales or rhonchi. No chest wall deformities or tenderness. Breast: normal by inspection . No dimpling, discharge, masses, tenderness or discharge . CV: PMI is nondisplaced, S1 S2 no gallops,  murmurs, rubs. Peripheral pulses are full without delay.No JVD .  ABDOMEN: Bowel sounds normal nontender  No guard or rebound, no hepato splenomegal no CVA tenderness.  No hernia. Extremtities:  No clubbing cyanosis or edema, no acute joint swelling or redness no focal atrophy NEURO:  Oriented x3, cranial nerves 3-12 appear to be intact, no obvious focal weakness,gait within normal limits no abnormal reflexes or asymmetrical SKIN: No acute rashes normal turgor, color, no bruising or petechiae. PSYCH: Oriented, good eye contact, no obvious depression anxiety, cognition and judgment  appear normal. LN: no cervical axillary inguinal adenopathy  Lab Results  Component Value Date   WBC 15.7 (H) 07/05/2019   HGB 13.5 07/05/2019   HCT 42.7 07/05/2019   PLT 307 07/05/2019   GLUCOSE 100 (H) 07/05/2019   CHOL 199 06/03/2015   TRIG 97.0 06/03/2015   HDL 36.60 (L) 06/03/2015   LDLCALC 143 (H) 06/03/2015   ALT 17 06/03/2015   AST 14 06/03/2015   NA 141 07/05/2019   K 4.3 07/05/2019   CL 105 07/05/2019   CREATININE 0.83 07/05/2019   BUN 13 07/05/2019   CO2 27 07/05/2019   TSH 3.64 06/03/2015   HGBA1C 5.9 07/08/2015    BP Readings from Last 3 Encounters:  01/24/21 (!) 143/81  07/06/19 131/79  02/13/19 120/72    Labplan reviewed with patient   ASSESSMENT AND PLAN:  Discussed the following assessment and plan:  No diagnosis found. No follow-ups on file.  Patient Care Team: Tavarius Grewe, Neta Mends, MD as PCP - General (Internal Medicine) Harold Hedge, MD as Consulting Physician (Obstetrics and Gynecology) There are no Patient Instructions on file for this visit.  Neta Mends. Balin Vandegrift M.D.

## 2021-02-05 ENCOUNTER — Encounter: Payer: Self-pay | Admitting: Internal Medicine

## 2021-02-05 DIAGNOSIS — Z0000 Encounter for general adult medical examination without abnormal findings: Secondary | ICD-10-CM

## 2021-02-05 DIAGNOSIS — F17200 Nicotine dependence, unspecified, uncomplicated: Secondary | ICD-10-CM

## 2021-03-21 DIAGNOSIS — M1712 Unilateral primary osteoarthritis, left knee: Secondary | ICD-10-CM | POA: Diagnosis not present

## 2021-04-09 ENCOUNTER — Encounter: Payer: Self-pay | Admitting: Internal Medicine

## 2021-04-11 DIAGNOSIS — M25562 Pain in left knee: Secondary | ICD-10-CM | POA: Diagnosis not present

## 2021-05-06 DIAGNOSIS — M25562 Pain in left knee: Secondary | ICD-10-CM | POA: Diagnosis not present

## 2021-05-13 DIAGNOSIS — M25562 Pain in left knee: Secondary | ICD-10-CM | POA: Diagnosis not present

## 2021-05-13 DIAGNOSIS — S83241D Other tear of medial meniscus, current injury, right knee, subsequent encounter: Secondary | ICD-10-CM | POA: Diagnosis not present

## 2021-05-21 DIAGNOSIS — Y999 Unspecified external cause status: Secondary | ICD-10-CM | POA: Diagnosis not present

## 2021-05-21 DIAGNOSIS — M94262 Chondromalacia, left knee: Secondary | ICD-10-CM | POA: Diagnosis not present

## 2021-05-21 DIAGNOSIS — G8918 Other acute postprocedural pain: Secondary | ICD-10-CM | POA: Diagnosis not present

## 2021-05-21 DIAGNOSIS — X58XXXA Exposure to other specified factors, initial encounter: Secondary | ICD-10-CM | POA: Diagnosis not present

## 2021-05-21 DIAGNOSIS — S83232A Complex tear of medial meniscus, current injury, left knee, initial encounter: Secondary | ICD-10-CM | POA: Diagnosis not present

## 2021-12-14 ENCOUNTER — Encounter: Payer: Self-pay | Admitting: Internal Medicine

## 2021-12-17 ENCOUNTER — Encounter: Payer: Self-pay | Admitting: Family Medicine

## 2021-12-17 ENCOUNTER — Other Ambulatory Visit: Payer: Self-pay

## 2021-12-17 ENCOUNTER — Telehealth (INDEPENDENT_AMBULATORY_CARE_PROVIDER_SITE_OTHER): Payer: BC Managed Care – PPO | Admitting: Family Medicine

## 2021-12-17 DIAGNOSIS — U071 COVID-19: Secondary | ICD-10-CM

## 2021-12-17 MED ORDER — MOLNUPIRAVIR EUA 200MG CAPSULE: 4.0000 | ORAL_CAPSULE | Freq: Two times a day (BID) | ORAL | 0 refills | Status: AC

## 2021-12-17 NOTE — Progress Notes (Signed)
Patient ID: Susan Peters, female   DOB: 06/06/1966, 55 y.o.   MRN: [id number]  This visit type was conducted due to national recommendations for restrictions regarding the COVID-19 pandemic in an effort to limit this patient's exposure and mitigate transmission in our community.   Virtual Visit via Video Note  I connected with Susan Peters on 12/17/21 at  9:00 AM EST by a video enabled telemedicine application and verified that I am speaking with the correct person using two identifiers.  Location patient: home Location provider:work or home office Persons participating in the virtual visit: patient, provider  I discussed the limitations of evaluation and management by telemedicine and the availability of in person appointments. The patient expressed understanding and agreed to proceed.   HPI:  Susan Peters states she tested positive for COVID Sunday.  Her mother recently passed away and she was at the funeral and her brother had called her Sunday night stating that he and his wife had both tested positive.  Susan Peters actually had some symptoms over the weekend.  She had some nasal congestion, fatigue, cough but no fever.  No dyspnea.  O2 sat stable.  She has had 2 vaccines +1 booster.  She felt like Sunday night and Monday she was turning a corner little bit but now feels like she feels slightly worse.  No nausea or vomiting.  Does have history of smoking but no chronic lung disease.  She is using Delsym night cough medicine which helps her cough at night.  She is not aware of any obvious wheezing.   ROS: See pertinent positives and negatives per HPI.  Past Medical History:  Diagnosis Date   Anemia    Arthritis    Back pain    Hx of bacterial pneumonia    Migraine    long standing 3-4 x per month takes med    Past Surgical History:  Procedure Laterality Date   KNEE SURGERY Left    1982   SHOULDER SURGERY Left    1981    Family History  Problem Relation Age of Onset   Arthritis Mother     Cancer Mother        Uterus   Hyperlipidemia Mother    Stroke Mother    Heart disease Mother    Hypertension Mother    Diabetes Mother    Hyperlipidemia Father    Heart disease Father    Hypertension Father    Early death Father    Cancer Maternal Aunt    Hypertension Maternal Aunt    Diabetes Maternal Aunt    Heart disease Paternal Uncle    Cancer Maternal Aunt        Lung   Diabetes Maternal Aunt    Diabetes Cousin    Arthritis Maternal Grandmother    Stroke Maternal Grandmother    Hypertension Maternal Grandmother    Arthritis Maternal Grandfather    Heart disease Maternal Grandfather    Hypertension Maternal Grandfather    Arthritis Paternal Grandmother    Heart disease Paternal Grandmother    Hypertension Paternal Grandmother    Diabetes Paternal Grandmother    Arthritis Paternal Grandfather    Heart disease Paternal Grandfather    Stroke Paternal Grandfather    Hypertension Paternal Grandfather    Kidney disease Paternal Grandfather     SOCIAL HX: Ongoing nicotine use   Current Outpatient Medications:    benzonatate (TESSALON PERLES) 100 MG capsule, Take 1 capsule (100 mg total) by mouth 3 (three) times daily  as needed., Disp: 20 capsule, Rfl: 0   fluticasone (FLONASE) 50 MCG/ACT nasal spray, Place 2 sprays into both nostrils daily., Disp: 16 g, Rfl: 6   guaiFENesin-codeine (ROBITUSSIN AC) 100-10 MG/5ML syrup, Take 5 mLs by mouth 4 (four) times daily as needed for cough. At  night, Disp: 120 mL, Rfl: 0   molnupiravir EUA (LAGEVRIO) 200 mg CAPS capsule, Take 4 capsules (800 mg total) by mouth 2 (two) times daily for 5 days., Disp: 40 capsule, Rfl: 0   PROAIR HFA 108 (90 Base) MCG/ACT inhaler, INHALE 2 PUFFS INTO THE LUNGS EVERY 6 HOURS AS NEEDED FOR WHEEZE OR SHORTNESS OF BREATH, Disp: 8.5 Inhaler, Rfl: 1   promethazine-dextromethorphan (PROMETHAZINE-DM) 6.25-15 MG/5ML syrup, Take 5 mLs by mouth 4 (four) times daily as needed for cough. At night, Disp: 118 mL, Rfl:  0   promethazine-dextromethorphan (PROMETHAZINE-DM) 6.25-15 MG/5ML syrup, Take 2.5-5 mLs by mouth 4 (four) times daily as needed for cough. At night, Disp: 118 mL, Rfl: 0  EXAM:  VITALS per patient if applicable:  GENERAL: alert, oriented, appears well and in no acute distress  HEENT: atraumatic, conjunttiva clear, no obvious abnormalities on inspection of external nose and ears  NECK: normal movements of the head and neck  LUNGS: on inspection no signs of respiratory distress, breathing rate appears normal, no obvious gross SOB, gasping or wheezing  CV: no obvious cyanosis  MS: moves all visible extremities without noticeable abnormality  PSYCH/NEURO: pleasant and cooperative, no obvious depression or anxiety, speech and thought processing grossly intact  ASSESSMENT AND PLAN:  Discussed the following assessment and plan:  COVID-19-patient is nearing end of window for antiviral therapy.  We discussed the fact that antivirals are not as likely to be efficacious the longer into illness this is.  Having said that she feels like her symptoms are not improving and we elected to go and cover with Molnupiravir 4 capsules by mouth twice daily for 5 days.  Plenty fluids and rest.  Continue to monitor O2 sats carefully.  Follow-up promptly for any worsening symptoms     I discussed the assessment and treatment plan with the patient. The patient was provided an opportunity to ask questions and all were answered. The patient agreed with the plan and demonstrated an understanding of the instructions.   The patient was advised to call back or seek an in-person evaluation if the symptoms worsen or if the condition fails to improve as anticipated.     Evelena Peat, MD

## 2022-05-09 ENCOUNTER — Emergency Department (HOSPITAL_COMMUNITY): Payer: BC Managed Care – PPO

## 2022-05-09 ENCOUNTER — Emergency Department (HOSPITAL_COMMUNITY)
Admission: EM | Admit: 2022-05-09 | Discharge: 2022-05-10 | Disposition: A | Discharge status: Home / Self Care | Payer: BC Managed Care – PPO | Attending: Emergency Medicine | Admitting: Emergency Medicine

## 2022-05-09 ENCOUNTER — Other Ambulatory Visit: Payer: Self-pay

## 2022-05-09 ENCOUNTER — Encounter (HOSPITAL_COMMUNITY): Payer: Self-pay | Admitting: *Deleted

## 2022-05-09 DIAGNOSIS — R079 Chest pain, unspecified: Secondary | ICD-10-CM

## 2022-05-09 DIAGNOSIS — R0789 Other chest pain: Secondary | ICD-10-CM | POA: Insufficient documentation

## 2022-05-09 LAB — CBC WITH DIFFERENTIAL/PLATELET
Abs Immature Granulocytes: 0.06 10*3/uL (ref 0.00–0.07)
Basophils Absolute: 0.1 10*3/uL (ref 0.0–0.1)
Basophils Relative: 1 %
Eosinophils Absolute: 0.3 10*3/uL (ref 0.0–0.5)
Eosinophils Relative: 2 %
HCT: 43.5 % (ref 36.0–46.0)
Hemoglobin: 13.4 g/dL (ref 12.0–15.0)
Immature Granulocytes: 0 %
Lymphocytes Relative: 35 %
Lymphs Abs: 5.7 10*3/uL — ABNORMAL HIGH (ref 0.7–4.0)
MCH: 28.6 pg (ref 26.0–34.0)
MCHC: 30.8 g/dL (ref 30.0–36.0)
MCV: 92.9 fL (ref 80.0–100.0)
Monocytes Absolute: 1 10*3/uL (ref 0.1–1.0)
Monocytes Relative: 6 %
Neutro Abs: 9 10*3/uL — ABNORMAL HIGH (ref 1.7–7.7)
Neutrophils Relative %: 56 %
Platelets: 349 10*3/uL (ref 150–400)
RBC: 4.68 MIL/uL (ref 3.87–5.11)
RDW: 13.3 % (ref 11.5–15.5)
WBC: 16.2 10*3/uL — ABNORMAL HIGH (ref 4.0–10.5)
nRBC: 0 % (ref 0.0–0.2)

## 2022-05-09 NOTE — ED Triage Notes (Signed)
The pt has had chest pain that started tonight and she has some lt arm pain  she took 81 mg of aspirin at 0945 ?

## 2022-05-09 NOTE — ED Provider Triage Note (Signed)
Emergency Medicine Provider Triage Evaluation Note ? ?Susan Peters , a 56 y.o. female  was evaluated in triage.  Pt complains of chest pain.  She checked her blood pressure and it was high.  She has pain in the right shoulder and into the right ear.  She reports that she had some "heart burn" at 5-6pm today.  She felt this in the throat and was belching.  The chest pain didn't go away.  ? ?She smokes a pack a day.  ?No leg swelling ? ?She denies history of HTN.  She was on metoprolol for palpitations.  ?She took 81 mg of asa ? ?She checked her blood pressure last week and was 142/80s.   ? ?Physical Exam  ?BP (!) 210/90 (BP Location: Left Arm)   Pulse 80   Temp 97.9 ?F (36.6 ?C)   Resp 19   Ht 5\' 3"  (1.6 m)   Wt 106.7 kg   LMP 11/25/2014   SpO2 90%   BMI 41.67 kg/m?  ?Gen:   Awake, no distress   ?Resp:  Normal effort  ?MSK:   Moves extremities without difficulty  ?Other:  Normal speech ? ?Medical Decision Making  ?Medically screening exam initiated at 11:12 PM.  Appropriate orders placed.  Gene Niska Kendrick was informed that the remainder of the evaluation will be completed by another provider, this initial triage assessment does not replace that evaluation, and the importance of remaining in the ED until their evaluation is complete. ? ? ? ?  ?Lorin Glass, PA-C ?05/09/22 2318 ? ?

## 2022-05-10 LAB — COMPREHENSIVE METABOLIC PANEL
ALT: 22 U/L (ref 0–44)
AST: 22 U/L (ref 15–41)
Albumin: 3.8 g/dL (ref 3.5–5.0)
Alkaline Phosphatase: 125 U/L (ref 38–126)
Anion gap: 9 (ref 5–15)
BUN: 10 mg/dL (ref 6–20)
CO2: 25 mmol/L (ref 22–32)
Calcium: 9.3 mg/dL (ref 8.9–10.3)
Chloride: 105 mmol/L (ref 98–111)
Creatinine, Ser: 0.79 mg/dL (ref 0.44–1.00)
GFR, Estimated: >60 mL/min (ref >60)
Glucose, Bld: 135 mg/dL — ABNORMAL HIGH (ref 70–99)
Potassium: 4.4 mmol/L (ref 3.5–5.1)
Sodium: 139 mmol/L (ref 135–145)
Total Bilirubin: 0.7 mg/dL (ref 0.3–1.2)
Total Protein: 7.1 g/dL (ref 6.5–8.1)

## 2022-05-10 LAB — TROPONIN I (HIGH SENSITIVITY)
Troponin I (High Sensitivity): 6 ng/L (ref <18)
Troponin I (High Sensitivity): 6 ng/L (ref <18)

## 2022-05-10 LAB — LIPASE, BLOOD: Lipase: 24 U/L (ref 11–51)

## 2022-05-10 NOTE — Discharge Instructions (Signed)
Take 4 over the counter ibuprofen tablets 3 times a day or 2 over-the-counter naproxen tablets twice a day for pain. Also take tylenol 1000mg(2 extra strength) four times a day.    

## 2022-05-10 NOTE — ED Provider Notes (Signed)
?[facility] EMERGENCY DEPARTMENT ?Provider Note ? ? ?CSN: [id number] ?Arrival date & time: 05/09/22  2219 ? ?  ? ?History ? ?Chief Complaint  ?Patient presents with  ? Chest Pain  ? ? ?Susan Peters is a 56 y.o. female. ? ?56 yo F with a chief complaints of chest pain.  This started this evening.  Felt initially like a burning consistent with her prior reflux and was having some belching.  Developed into more of a sharp pain that is worse with deep breathing twisting and turning in certain positions.  She denies any trauma to the chest.  Tells me that she did lift bed today.  She denies cough congestion or fever.  Had episode like this previously and was diagnosed with pneumonia. ? ? ?Chest Pain ? ?  ? ?Home Medications ?Prior to Admission medications   ?Medication Sig Start Date End Date Taking? Authorizing Provider  ?benzonatate (TESSALON PERLES) 100 MG capsule Take 1 capsule (100 mg total) by mouth 3 (three) times daily as needed. 02/12/19   Junie Spencer, FNP  ?fluticasone (FLONASE) 50 MCG/ACT nasal spray Place 2 sprays into both nostrils daily. 02/12/19   Junie Spencer, FNP  ?guaiFENesin-codeine (ROBITUSSIN AC) 100-10 MG/5ML syrup Take 5 mLs by mouth 4 (four) times daily as needed for cough. At  night 02/13/19   Panosh, Neta Mends, MD  ?PROAIR HFA 108 [phone] Base) MCG/ACT inhaler INHALE 2 PUFFS INTO THE LUNGS EVERY 6 HOURS AS NEEDED FOR WHEEZE OR SHORTNESS OF BREATH 03/10/19   Panosh, Neta Mends, MD  ?promethazine-dextromethorphan (PROMETHAZINE-DM) 6.25-15 MG/5ML syrup Take 5 mLs by mouth 4 (four) times daily as needed for cough. At night 04/18/18   Panosh, Neta Mends, MD  ?promethazine-dextromethorphan (PROMETHAZINE-DM) 6.25-15 MG/5ML syrup Take 2.5-5 mLs by mouth 4 (four) times daily as needed for cough. At night 02/13/19   Panosh, Neta Mends, MD  ?   ? ?Allergies    ?Patient has no known allergies.   ? ?Review of Systems   ?Review of Systems  ?Cardiovascular:  Positive for chest pain.  ? ?Physical  Exam ?Updated Vital Signs ?BP (!) 152/81 (BP Location: Left Arm)   Pulse 77   Temp 98.4 ?F (36.9 ?C) (Oral)   Resp 20   Ht 5\' 3"  (1.6 m)   Wt 106.7 kg   LMP 11/25/2014   SpO2 99%   BMI 41.67 kg/m?  ?Physical Exam ?Vitals and nursing note reviewed.  ?Constitutional:   ?   General: She is not in acute distress. ?   Appearance: She is well-developed. She is not diaphoretic.  ?HENT:  ?   Head: Normocephalic and atraumatic.  ?Eyes:  ?   Pupils: Pupils are equal, round, and reactive to light.  ?Cardiovascular:  ?   Rate and Rhythm: Normal rate and regular rhythm.  ?   Heart sounds: No murmur heard. ?  No friction rub. No gallop.  ?Pulmonary:  ?   Effort: Pulmonary effort is normal.  ?   Breath sounds: No wheezing or rales.  ?Chest:  ?   Chest wall: Tenderness present.  ?   Comments: Pain with palpation to the r chest wall adjacent to the sternum. ?Abdominal:  ?   General: There is no distension.  ?   Palpations: Abdomen is soft.  ?   Tenderness: There is no abdominal tenderness.  ?Musculoskeletal:     ?   General: No tenderness.  ?   Cervical back: Normal range of motion and  neck supple.  ?Skin: ?   General: Skin is warm and dry.  ?Neurological:  ?   Mental Status: She is alert and oriented to person, place, and time.  ?Psychiatric:     ?   Behavior: Behavior normal.  ? ? ?ED Results / Procedures / Treatments   ?Labs ?(all labs ordered are listed, but only abnormal results are displayed) ?Labs Reviewed  ?COMPREHENSIVE METABOLIC PANEL - Abnormal; Notable for the following components:  ?    Result Value  ? Glucose, Bld 135 (*)   ? All other components within normal limits  ?CBC WITH DIFFERENTIAL/PLATELET - Abnormal; Notable for the following components:  ? WBC 16.2 (*)   ? Neutro Abs 9.0 (*)   ? Lymphs Abs 5.7 (*)   ? All other components within normal limits  ?LIPASE, BLOOD  ?TROPONIN I (HIGH SENSITIVITY)  ?TROPONIN I (HIGH SENSITIVITY)  ? ? ?EKG ?EKG Interpretation ? ?Date/Time:  Saturday May 09 2022 22:34:12  EDT ?Ventricular Rate:  74 ?PR Interval:  154 ?QRS Duration: 80 ?QT Interval:  406 ?QTC Calculation: 450 ?R Axis:   -5 ?Text Interpretation: Normal sinus rhythm Minimal voltage criteria for LVH, may be normal variant ( R in aVL ) Confirmed by Nicanor Alcon, April ([id number]) on 05/10/2022 4:11:40 AM ? ?Radiology ?DG Chest 2 View ? ?Result Date: 05/09/2022 ?CLINICAL DATA:  Chest pain and elevated blood pressure. EXAM: CHEST - 2 VIEW COMPARISON:  July 05, 2019 FINDINGS: The heart size and mediastinal contours are within normal limits. Both lungs are clear. Multilevel degenerative changes seen throughout the thoracic spine. IMPRESSION: No active cardiopulmonary disease. Electronically Signed   By: Aram Candela M.D.   On: 05/09/2022 23:35   ? ?Procedures ?Procedures  ? ? ?Medications Ordered in ED ?Medications - No data to display ? ?ED Course/ Medical Decision Making/ A&P ?  ?                        ?Medical Decision Making ? ?56 yo F with a cc of chest pain.  Atypical in nature and reproduced on exam.  Lifted a bed today.  Likely musculoskeletal.  Two trops negative. No noted electrolyte abnormality.  Cxr independently interpreted by me without focal infiltrate or ptx.  ? ?5:14 AM:  I have discussed the diagnosis/risks/treatment options with the patient and family.  Evaluation and diagnostic testing in the emergency department does not suggest an emergent condition requiring admission or immediate intervention beyond what has been performed at this time.  They will follow up with PCP. We also discussed returning to the ED immediately if new or worsening sx occur. We discussed the sx which are most concerning (e.g., sudden worsening pain, fever, inability to tolerate by mouth) that necessitate immediate return. Medications administered to the patient during their visit and any new prescriptions provided to the patient are listed below. ? ?Medications given during this visit ?Medications - No data to display ? ? ?The patient  appears reasonably screen and/or stabilized for discharge and I doubt any other medical condition or other [facility] requiring further screening, evaluation, or treatment in the ED at this time prior to discharge.  ? ? ? ? ? ? ? ? ?Final Clinical Impression(s) / ED Diagnoses ?Final diagnoses:  ?Nonspecific chest pain  ? ? ?Rx / DC Orders ?ED Discharge Orders   ? ? None  ? ?  ? ? ?  ?Melene Plan, DO ?05/10/22 6789 ? ?

## 2022-05-10 NOTE — ED Notes (Signed)
Pt states "it's increasingly harder to breath it's causing a sharp pain that shoots up my neck and through to my back between the shoulder blades and it hurts to much to take a full breath"  ?

## 2022-05-11 NOTE — Progress Notes (Deleted)
No chief complaint on file.   HPI: Susan Peters 56 y.o. come in for Fu ed visit   5 13 for ns chest pain  Last visit with me was 2 2020  however had vido viist with dr Elease Hashimoto when she had covid 19 12 22    Medical Decision Making   56 yo F with a cc of chest pain.  Atypical in nature and reproduced on exam.  Lifted a bed today.  Likely musculoskeletal.  Two trops negative. No noted electrolyte abnormality.  Cxr independently interpreted by me without focal infiltrate or ptx.  ROS: See pertinent positives and negatives per HPI.  Past Medical History:  Diagnosis Date   Anemia    Arthritis    Back pain    Hx of bacterial pneumonia    Migraine    long standing 3-4 x per month takes med    Family History  Problem Relation Age of Onset   Arthritis Mother    Cancer Mother        Uterus   Hyperlipidemia Mother    Stroke Mother    Heart disease Mother    Hypertension Mother    Diabetes Mother    Hyperlipidemia Father    Heart disease Father    Hypertension Father    Early death Father    Cancer Maternal Aunt    Hypertension Maternal Aunt    Diabetes Maternal Aunt    Heart disease Paternal Uncle    Cancer Maternal Aunt        Lung   Diabetes Maternal Aunt    Diabetes Cousin    Arthritis Maternal Grandmother    Stroke Maternal Grandmother    Hypertension Maternal Grandmother    Arthritis Maternal Grandfather    Heart disease Maternal Grandfather    Hypertension Maternal Grandfather    Arthritis Paternal Grandmother    Heart disease Paternal Grandmother    Hypertension Paternal Grandmother    Diabetes Paternal Grandmother    Arthritis Paternal Grandfather    Heart disease Paternal Grandfather    Stroke Paternal Grandfather    Hypertension Paternal Grandfather    Kidney disease Paternal Grandfather     Social History   Socioeconomic History   Marital status: Single    Spouse name: Not on file   Number of children: Not on file   Years of education: Not on  file   Highest education level: Not on file  Occupational History   Not on file  Tobacco Use   Smoking status: Every Day    Packs/day: 0.50    Types: Cigarettes   Smokeless tobacco: Never  Vaping Use   Vaping Use: Never used  Substance and Sexual Activity   Alcohol use: Yes    Alcohol/week: 0.0 standard drinks    Comment: social   Drug use: No   Sexual activity: Not on file  Other Topics Concern   Not on file  Social History Narrative   7-8 hours of sleep per night   Lives with her roommate   Works full time as a Conservation officer, nature one step further    Holiday representative   2 Cats in the home roomates    G1P1  Lives with father and visits    Net fa pos tob etoh    From [place]    Social Determinants of Health   Financial Resource Strain: Not on file  Food Insecurity: Not on file  Transportation Needs: Not on file  Physical Activity: Not on file  Stress: Not on file  Social Connections: Not on file    Outpatient Medications Prior to Visit  Medication Sig Dispense Refill   benzonatate (TESSALON PERLES) 100 MG capsule Take 1 capsule (100 mg total) by mouth 3 (three) times daily as needed. 20 capsule 0   fluticasone (FLONASE) 50 MCG/ACT nasal spray Place 2 sprays into both nostrils daily. 16 g 6   guaiFENesin-codeine (ROBITUSSIN AC) 100-10 MG/5ML syrup Take 5 mLs by mouth 4 (four) times daily as needed for cough. At  night 120 mL 0   PROAIR HFA 108 (90 Base) MCG/ACT inhaler INHALE 2 PUFFS INTO THE LUNGS EVERY 6 HOURS AS NEEDED FOR WHEEZE OR SHORTNESS OF BREATH 8.5 Inhaler 1   promethazine-dextromethorphan (PROMETHAZINE-DM) 6.25-15 MG/5ML syrup Take 5 mLs by mouth 4 (four) times daily as needed for cough. At night 118 mL 0   promethazine-dextromethorphan (PROMETHAZINE-DM) 6.25-15 MG/5ML syrup Take 2.5-5 mLs by mouth 4 (four) times daily as needed for cough. At night 118 mL 0   No facility-administered medications prior to visit.      EXAM:  LMP 11/25/2014   There is no height or weight on file to calculate BMI.  GENERAL: vitals reviewed and listed above, alert, oriented, appears well hydrated and in no acute distress HEENT: atraumatic, conjunctiva  clear, no obvious abnormalities on inspection of external nose and ears OP : no lesion edema or exudate  NECK: no obvious masses on inspection palpation  LUNGS: clear to auscultation bilaterally, no wheezes, rales or rhonchi, good air movement CV: HRRR, no clubbing cyanosis or  peripheral edema nl cap refill  MS: moves all extremities without noticeable focal  abnormality PSYCH: pleasant and cooperative, no obvious depression or anxiety Lab Results  Component Value Date   WBC 16.2 (H) 05/09/2022   HGB 13.4 05/09/2022   HCT 43.5 05/09/2022   PLT 349 05/09/2022   GLUCOSE 135 (H) 05/09/2022   CHOL 199 06/03/2015   TRIG 97.0 06/03/2015   HDL 36.60 (L) 06/03/2015   LDLCALC 143 (H) 06/03/2015   ALT 22 05/09/2022   AST 22 05/09/2022   NA 139 05/09/2022   K 4.4 05/09/2022   CL 105 05/09/2022   CREATININE 0.79 05/09/2022   BUN 10 05/09/2022   CO2 25 05/09/2022   TSH 3.64 06/03/2015   HGBA1C 5.9 07/08/2015   BP Readings from Last 3 Encounters:  05/10/22 (!) 152/81  01/24/21 (!) 143/81  07/06/19 131/79    ASSESSMENT AND PLAN:  Discussed the following assessment and plan:  No diagnosis found.  -Patient advised to return or notify health care team  if  new concerns arise.  There are no Patient Instructions on file for this visit.   Standley Brooking. Mabry Tift M.D.

## 2022-05-12 ENCOUNTER — Ambulatory Visit: Payer: BC Managed Care – PPO | Admitting: Internal Medicine

## 2022-05-13 ENCOUNTER — Ambulatory Visit: Payer: BC Managed Care – PPO | Admitting: Internal Medicine

## 2022-05-13 ENCOUNTER — Encounter: Payer: Self-pay | Admitting: Internal Medicine

## 2022-05-13 VITALS — BP 134/82 | HR 88 | Temp 98.4°F | Ht 63.0 in | Wt 247.8 lb

## 2022-05-13 DIAGNOSIS — F17200 Nicotine dependence, unspecified, uncomplicated: Secondary | ICD-10-CM | POA: Diagnosis not present

## 2022-05-13 DIAGNOSIS — R079 Chest pain, unspecified: Secondary | ICD-10-CM | POA: Diagnosis not present

## 2022-05-13 DIAGNOSIS — D72829 Elevated white blood cell count, unspecified: Secondary | ICD-10-CM | POA: Diagnosis not present

## 2022-05-13 DIAGNOSIS — I10 Essential (primary) hypertension: Secondary | ICD-10-CM | POA: Diagnosis not present

## 2022-05-13 DIAGNOSIS — R739 Hyperglycemia, unspecified: Secondary | ICD-10-CM

## 2022-05-13 MED ORDER — VALSARTAN 80 MG PO TABS: 80.0000 mg | ORAL_TABLET | Freq: Every day | ORAL | 1 refills | Status: DC

## 2022-05-13 NOTE — Progress Notes (Signed)
? ?Chief Complaint  ?Patient presents with  ? Hospitalization Follow-up  ? ? ?HPI: ?Susan Peters 56 y.o. come in for FU ed  5 13 for CP      last seen by me over 02/23/19, Dr Caryl Never video 12 22  for covid     f19 infection given monupiravir . ?Fam hx of HT  mom  aunts and uncles.  ?At  home  previously  120 140 range maybe  and then up since visit .  ?She presented with the ED with chest pain that originally started off like heartburn with some belching and then changed over to right midline up to the neck sharp at times and may be shoulder area pain also.  It was continuous and progressive when she went to the emergency room since that time her pain has been a 0 but she has been checking her blood pressure readings at home and most of them are in the 150 systolic range diastolics 80+ lowest blood pressure was about 142/80. ? ?She feels like she might be ready to stop tobacco ? 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  And was given I believe metoprolol or beta-blocker but it did not help so she did not continue this this was a long time ago.  Every  6 months  ? ?Medical Decision Making ?  ?56 yo F with a cc of chest pain.  Atypical in nature and reproduced on exam.  Lifted a bed today.  Likely musculoskeletal.  Two trops negative. No noted electrolyte abnormality.  Cxr independently interpreted by me without focal infiltrate or ptx.  ?  ?5:14 AM:  I have discussed the diagnosis/risks/treatment options with the patient and family.  Evaluation and diagnostic testing in the emergency department does not suggest an emergent condition requiring admission or immediate intervention beyond what has been performed at this time.  They will follow up with PCP. We also discussed returning to the ED immediately if new or worsening sx occur. We discussed the sx which are most  concerning (e.g., sudden worsening pain, fever, inability to tolerate by mouth) that necessitate immediate return. Medications administered to the patient during their visit and any new prescriptions provided to the patient are listed below. ?  ?ROS: See pertinent positives and negatives per HPI. ? ?Past Medical History:  ?Diagnosis Date  ? Anemia   ? Arthritis   ? Back pain   ? Hx of bacterial pneumonia   ? Migraine   ? long standing 3-4 x per month takes med  ? ? ?Family History  ?Problem Relation Age of Onset  ? Arthritis Mother   ? Cancer Mother   ?     Uterus  ? Hyperlipidemia Mother   ? Stroke Mother   ? Heart disease Mother   ? Hypertension Mother   ? Diabetes Mother   ? Hyperlipidemia Father   ? Heart disease Father   ? Hypertension Father   ? Early death Father   ? Cancer Maternal Aunt   ? Hypertension Maternal Aunt   ? Diabetes Maternal Aunt   ? Heart disease Paternal Uncle   ? Cancer Maternal Aunt   ?     Lung  ? Diabetes Maternal Aunt   ? Diabetes Cousin   ?  Arthritis Maternal Grandmother   ? Stroke Maternal Grandmother   ? Hypertension Maternal Grandmother   ? Arthritis Maternal Grandfather   ? Heart disease Maternal Grandfather   ? Hypertension Maternal Grandfather   ? Arthritis Paternal Grandmother   ? Heart disease Paternal Grandmother   ? Hypertension Paternal Grandmother   ? Diabetes Paternal Grandmother   ? Arthritis Paternal Grandfather   ? Heart disease Paternal Grandfather   ? Stroke Paternal Grandfather   ? Hypertension Paternal Grandfather   ? Kidney disease Paternal Grandfather   ? ? ?Social History  ? ?Socioeconomic History  ? Marital status: Single  ?  Spouse name: Not on file  ? Number of children: Not on file  ? Years of education: Not on file  ? Highest education level: Not on file  ?Occupational History  ? Not on file  ?Tobacco Use  ? Smoking status: Every Day  ?  Packs/day: 0.50  ?  Types: Cigarettes  ? Smokeless tobacco: Never  ?Vaping Use  ? Vaping Use: Never used  ?Substance and  Sexual Activity  ? Alcohol use: Yes  ?  Alcohol/week: 0.0 standard drinks  ?  Comment: social  ? Drug use: No  ? Sexual activity: Not on file  ?Other Topics Concern  ? Not on file  ?Social History Narrative  ? 7-8 hours of sleep per night  ? Lives with her roommate  ? 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 Prev Geophysical data processorconstruction manager  ? 2 Cats in the home roomates   ? G1P1  Lives with father and visits   ? Net fa pos tob etoh   ? From [place] [employer]  [employer]   ? ?Social Determinants of Health  ? ?Financial Resource Strain: Not on file  ?Food Insecurity: Not on file  ?Transportation Needs: Not on file  ?Physical Activity: Not on file  ?Stress: Not on file  ?Social Connections: Not on file  ? ? ?Outpatient Medications Prior to Visit  ?Medication Sig Dispense Refill  ? benzonatate (TESSALON PERLES) 100 MG capsule Take 1 capsule (100 mg total) by mouth 3 (three) times daily as needed. (Patient not taking: Reported on 05/13/2022) 20 capsule 0  ? fluticasone (FLONASE) 50 MCG/ACT nasal spray Place 2 sprays into both nostrils daily. (Patient not taking: Reported on 05/13/2022) 16 g 6  ? guaiFENesin-codeine (ROBITUSSIN AC) 100-10 MG/5ML syrup Take 5 mLs by mouth 4 (four) times daily as needed for cough. At  night (Patient not taking: Reported on 05/13/2022) 120 mL 0  ? PROAIR HFA 108 (90 Base) MCG/ACT inhaler INHALE 2 PUFFS INTO THE LUNGS EVERY 6 HOURS AS NEEDED FOR WHEEZE OR SHORTNESS OF BREATH (Patient not taking: Reported on 05/13/2022) 8.5 Inhaler 1  ? promethazine-dextromethorphan (PROMETHAZINE-DM) 6.25-15 MG/5ML syrup Take 5 mLs by mouth 4 (four) times daily as needed for cough. At night (Patient not taking: Reported on 05/13/2022) 118 mL 0  ? promethazine-dextromethorphan (PROMETHAZINE-DM) 6.25-15 MG/5ML syrup Take 2.5-5 mLs by mouth 4 (four) times daily as needed for cough. At night (Patient not taking: Reported on 05/13/2022) 118 mL 0  ? ?No facility-administered medications prior to  visit.  ? ? ? ?EXAM: ? ?BP 134/82 (BP Location: Left Arm, Patient Position: Sitting, Cuff Size: Normal)   Pulse 88   Temp 98.4 ?F (36.9 ?C) (Oral)   Ht 5\' 3"  (1.6 m)   Wt 247 lb 12.8 oz (112.4 kg)   LMP 11/25/2014  SpO2 98%   BMI 43.90 kg/m?  ? ?Body mass index is 43.9 kg/m?. ?144/82r today . ?GENERAL: vitals reviewed and listed above, alert, oriented, appears well hydrated and in no acute distress ?HEENT: atraumatic, conjunctiva  clear, no obvious abnormalities on inspection of external nose and ears  ?NECK: no obvious masses on inspection palpation  ?LUNGS: clear to auscultation bilaterally, no wheezes, rales or rhonchi, good air movement ?CV: HRRR, no clubbing cyanosis or  peripheral edema nl cap refill  ?Abdomen:  Sof,t normal bowel sounds without hepatosplenomegaly, no guarding rebound or masses no CVA tenderness ?MS: moves all extremities without noticeable focal  abnormality ?PSYCH: pleasant and cooperative, no obvious depression or anxiety ?Lab Results  ?Component Value Date  ? WBC 16.2 (H) 05/09/2022  ? HGB 13.4 05/09/2022  ? HCT 43.5 05/09/2022  ? PLT 349 05/09/2022  ? GLUCOSE 135 (H) 05/09/2022  ? CHOL 199 06/03/2015  ? TRIG 97.0 06/03/2015  ? HDL 36.60 (L) 06/03/2015  ? LDLCALC 143 (H) 06/03/2015  ? ALT 22 05/09/2022  ? AST 22 05/09/2022  ? NA 139 05/09/2022  ? K 4.4 05/09/2022  ? CL 105 05/09/2022  ? CREATININE 0.79 05/09/2022  ? BUN 10 05/09/2022  ? CO2 25 05/09/2022  ? TSH 3.64 06/03/2015  ? HGBA1C 5.9 07/08/2015  ? ?BP Readings from Last 3 Encounters:  ?05/13/22 134/82  ?05/10/22 (!) 152/81  ?01/24/21 (!) 143/81  ? ? ?ASSESSMENT AND PLAN: ? ?Discussed the following assessment and plan: ? ?Chest pain, unspecified type - resolved atypical could be GI ? has risk .  - Plan: Lipid panel, CBC with Differential/Platelet, Hemoglobin A1c, Hemoglobin A1c, CBC with Differential/Platelet, Lipid panel ? ?Leukocytosis, unspecified type - Plan: Lipid panel, CBC with Differential/Platelet, Hemoglobin A1c,  Hemoglobin A1c, CBC with Differential/Platelet, Lipid panel ? ?Tobacco use disorder - Plan: Lipid panel, CBC with Differential/Platelet, Hemoglobin A1c, Hemoglobin A1c, CBC with Differential/Platelet, Lipid panel ? ?[place]

## 2022-05-13 NOTE — Patient Instructions (Addendum)
Lab today. ?Begin  bp medication  ? ?Yes stop tobacco  ?Please bring your blood pressure cuff to next appointment ?Take blood pressure readings twice a day for 3-5 days and record .     ?Take 2 -3 readings at each sitting .  ? ?Can send in readings  by My Chart.  ? ?  ?Before checking your blood pressure make sure: ?You are seated and quite for 5 min before checking ?Feet are flat on the floor ?Siting in chair with your back supported straight up and down ?Arm resting on table or arm of chair at heart level ?Bladder is empty ?You have NOT had caffeine or tobacco within the last 30 min ? ?[email or web address] ? ?

## 2022-05-14 LAB — LIPID PANEL
Cholesterol: 193 mg/dL (ref 0–200)
HDL: 36.9 mg/dL — ABNORMAL LOW (ref >39.00)
LDL Cholesterol: 131 mg/dL — ABNORMAL HIGH (ref 0–99)
NonHDL: 156.46
Total CHOL/HDL Ratio: 5
Triglycerides: 129 mg/dL (ref 0.0–149.0)
VLDL: 25.8 mg/dL (ref 0.0–40.0)

## 2022-05-14 LAB — CBC WITH DIFFERENTIAL/PLATELET
Basophils Absolute: 0.5 10*3/uL — ABNORMAL HIGH (ref 0.0–0.1)
Basophils Relative: 3 % (ref 0.0–3.0)
Eosinophils Absolute: 0.4 10*3/uL (ref 0.0–0.7)
Eosinophils Relative: 2.6 % (ref 0.0–5.0)
HCT: 38.9 % (ref 36.0–46.0)
Hemoglobin: 12.8 g/dL (ref 12.0–15.0)
Lymphocytes Relative: 29 % (ref 12.0–46.0)
Lymphs Abs: 4.5 10*3/uL — ABNORMAL HIGH (ref 0.7–4.0)
MCHC: 32.8 g/dL (ref 30.0–36.0)
MCV: 88 fl (ref 78.0–100.0)
Monocytes Absolute: 0.9 10*3/uL (ref 0.1–1.0)
Monocytes Relative: 5.9 % (ref 3.0–12.0)
Neutro Abs: 9.2 10*3/uL — ABNORMAL HIGH (ref 1.4–7.7)
Neutrophils Relative %: 59.5 % (ref 43.0–77.0)
Platelets: 315 10*3/uL (ref 150.0–400.0)
RBC: 4.42 Mil/uL (ref 3.87–5.11)
RDW: 13.6 % (ref 11.5–15.5)
WBC: 15.5 10*3/uL — ABNORMAL HIGH (ref 4.0–10.5)

## 2022-05-14 LAB — HEMOGLOBIN A1C: Hgb A1c MFr Bld: 6.3 % (ref 4.6–6.5)

## 2022-05-15 NOTE — Progress Notes (Signed)
WBC  still up but  slightly  lower  cholesterol modestly elevated . A1c is in pre diabtetic range but no diabetes.    Continue lifestyle intervention healthy eating and exercise . As planned and bp control and we can discuss at your FU visit  Consideration of adding   other medication for lipids.  The 10-year ASCVD risk score (Arnett DK, et al., 2019) is: 9.8%   Values used to calculate the score:     Age: 56 years     Sex: Female     Is Non-Hispanic African American: No     Diabetic: No     Tobacco smoker: Yes     Systolic Blood Pressure: 134 mmHg     Is BP treated: Yes     HDL Cholesterol: 36.9 mg/dL     Total Cholesterol: 193 mg/dL

## 2022-07-13 ENCOUNTER — Ambulatory Visit: Payer: BC Managed Care – PPO | Admitting: Internal Medicine

## 2022-07-13 ENCOUNTER — Encounter: Payer: Self-pay | Admitting: Internal Medicine

## 2022-07-13 ENCOUNTER — Other Ambulatory Visit: Payer: Self-pay | Admitting: Internal Medicine

## 2022-07-13 VITALS — BP 134/82 | HR 75 | Temp 98.7°F | Ht 63.0 in | Wt 243.8 lb

## 2022-07-13 DIAGNOSIS — I10 Essential (primary) hypertension: Secondary | ICD-10-CM

## 2022-07-13 DIAGNOSIS — E785 Hyperlipidemia, unspecified: Secondary | ICD-10-CM

## 2022-07-13 DIAGNOSIS — R739 Hyperglycemia, unspecified: Secondary | ICD-10-CM

## 2022-07-13 DIAGNOSIS — Z79899 Other long term (current) drug therapy: Secondary | ICD-10-CM

## 2022-07-13 DIAGNOSIS — F17200 Nicotine dependence, unspecified, uncomplicated: Secondary | ICD-10-CM | POA: Diagnosis not present

## 2022-07-13 LAB — BASIC METABOLIC PANEL
BUN: 12 mg/dL (ref 6–23)
CO2: 27 mEq/L (ref 19–32)
Calcium: 9.4 mg/dL (ref 8.4–10.5)
Chloride: 104 mEq/L (ref 96–112)
Creatinine, Ser: 0.68 mg/dL (ref 0.40–1.20)
GFR: 97.93 mL/min (ref >60.00)
Glucose, Bld: 93 mg/dL (ref 70–99)
Potassium: 4.2 mEq/L (ref 3.5–5.1)
Sodium: 140 mEq/L (ref 135–145)

## 2022-07-13 MED ORDER — VALSARTAN 160 MG PO TABS
160.0000 mg | ORAL_TABLET | Freq: Every day | ORAL | 1 refills | Status: DC
Start: 2022-07-13 — End: 2023-01-08

## 2022-07-13 MED ORDER — ROSUVASTATIN CALCIUM 5 MG PO TABS: 5.0000 mg | ORAL_TABLET | Freq: Every day | ORAL | 1 refills | Status: DC

## 2022-07-13 NOTE — Patient Instructions (Addendum)
BP is getting better .  Goal below 130/80 average range  Increase the valsartan to 160 mg  per day  to get the plus life style. Adding  low dose crestor   5 mg per day    Plan lab in 3-4 months and ROV or Virtual

## 2022-07-13 NOTE — Progress Notes (Signed)
3-4 mos future labs

## 2022-07-13 NOTE — Progress Notes (Signed)
Blood sugar kidney function and potassium are all normal.   Continue with plan as discussed at visit

## 2022-07-13 NOTE — Progress Notes (Signed)
Chief Complaint  Patient presents with   Follow-up    HPI: Susan Peters 56 y.o. come in for laboratory studies hypertension risk reduction. Since last visit she has been taking valsartan 80 mg without difficulty blood pressure reading log is presented and of note blood pressure ranges are coming down mostly below 140/80 range definitely below 150 She brings in her wrist monitor also. Tobacco stopped in the remote past and back because she was "angry" has cut down to about 3/4 pack is thinking about it.  Is aware of risk.  Does not want to take a lot of medicine but is willing to take medication to reduce her lipid based on family history. No new chest pain shortness of breath that is worrisome change in status otherwise. ROS: See pertinent positives and negatives per HPI.  Past Medical History:  Diagnosis Date   Anemia    Arthritis    Back pain    Hx of bacterial pneumonia    Migraine    long standing 3-4 x per month takes med    Family History  Problem Relation Age of Onset   Arthritis Mother    Cancer Mother        Uterus   Hyperlipidemia Mother    Stroke Mother    Heart disease Mother    Hypertension Mother    Diabetes Mother    Hyperlipidemia Father    Heart disease Father    Hypertension Father    Early death Father    Cancer Maternal Aunt    Hypertension Maternal Aunt    Diabetes Maternal Aunt    Heart disease Paternal Uncle    Cancer Maternal Aunt        Lung   Diabetes Maternal Aunt    Diabetes Cousin    Arthritis Maternal Grandmother    Stroke Maternal Grandmother    Hypertension Maternal Grandmother    Arthritis Maternal Grandfather    Heart disease Maternal Grandfather    Hypertension Maternal Grandfather    Arthritis Paternal Grandmother    Heart disease Paternal Grandmother    Hypertension Paternal Grandmother    Diabetes Paternal Grandmother    Arthritis Paternal Grandfather    Heart disease Paternal Grandfather    Stroke Paternal  Grandfather    Hypertension Paternal Grandfather    Kidney disease Paternal Grandfather     Social History   Socioeconomic History   Marital status: Single    Spouse name: Not on file   Number of children: Not on file   Years of education: Not on file   Highest education level: Not on file  Occupational History   Not on file  Tobacco Use   Smoking status: Every Day    Packs/day: 0.50    Types: Cigarettes   Smokeless tobacco: Never  Vaping Use   Vaping Use: Never used  Substance and Sexual Activity   Alcohol use: Yes    Alcohol/week: 0.0 standard drinks of alcohol    Comment: social   Drug use: No   Sexual activity: Not on file  Other Topics Concern   Not on file  Social History Narrative   7-8 hours of sleep per night   Lives with her roommate   Works full time as a Education officer, 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 in the home roomates    G1P1  Lives with father and visits    Net fa pos tob etoh  [place]   [employer]  [place]    Social Determinants of Health   Financial Resource Strain: Not on file  Food Insecurity: Not on file  Transportation Needs: Not on file  Physical Activity: Not on file  Stress: Not on file  Social Connections: Not on file    Outpatient Medications Prior to Visit  Medication Sig Dispense Refill   fluticasone (FLONASE) 50 MCG/ACT nasal spray Place 2 sprays into both nostrils daily. 16 g 6   guaiFENesin-codeine (ROBITUSSIN AC) 100-10 MG/5ML syrup Take 5 mLs by mouth 4 (four) times daily as needed for cough. At  night 120 mL 0   PROAIR HFA 108 (90 Base) MCG/ACT inhaler INHALE 2 PUFFS INTO THE LUNGS EVERY 6 HOURS AS NEEDED FOR WHEEZE OR SHORTNESS OF BREATH 8.5 Inhaler 1   promethazine-dextromethorphan (PROMETHAZINE-DM) 6.25-15 MG/5ML syrup Take 5 mLs by mouth 4 (four) times daily as needed for cough. At night 118 mL 0   benzonatate (TESSALON PERLES) 100 MG capsule Take 1 capsule (100 mg total) by  mouth 3 (three) times daily as needed. 20 capsule 0   promethazine-dextromethorphan (PROMETHAZINE-DM) 6.25-15 MG/5ML syrup Take 2.5-5 mLs by mouth 4 (four) times daily as needed for cough. At night 118 mL 0   valsartan (DIOVAN) 80 MG tablet Take 1 tablet (80 mg total) by mouth daily. 90 tablet 1   No facility-administered medications prior to visit.     EXAM: Blood pressure was 134/82 and her wrist monitor was 136/86 BP 134/82 (BP Location: Left Arm, Patient Position: Sitting, Cuff Size: Normal)   Pulse 75   Temp 98.7 F (37.1 C) (Oral)   Ht 5\' 3"  (1.6 m)   Wt 243 lb 12.8 oz (110.6 kg)   LMP 11/25/2014   SpO2 97%   BMI 43.19 kg/m   Body mass index is 43.19 kg/m.  GENERAL: vitals reviewed and listed above, alert, oriented, appears well hydrated and in no acute distress odor of tobacco HEENT: atraumatic, conjunctiva  clear, no obvious abnormalities on inspection of external nose and ears  NECK: no obvious masses on inspection palpation  LUNGS: clear to auscultation bilaterally, no wheezes, rales or rhonchi, good air movement CV: HRRR, no clubbing cyanosis or  peripheral edema nl cap refill  MS: moves all extremities without noticeable focal  abnormality PSYCH: pleasant and cooperative, no obvious depression or anxiety Lab Results  Component Value Date   WBC 15.5 (H) 05/13/2022   HGB 12.8 05/13/2022   HCT 38.9 05/13/2022   PLT 315.0 05/13/2022   GLUCOSE 93 07/13/2022   CHOL 193 05/13/2022   TRIG 129.0 05/13/2022   HDL 36.90 (L) 05/13/2022   LDLCALC 131 (H) 05/13/2022   ALT 22 05/09/2022   AST 22 05/09/2022   NA 140 07/13/2022   K 4.2 07/13/2022   CL 104 07/13/2022   CREATININE 0.68 07/13/2022   BUN 12 07/13/2022   CO2 27 07/13/2022   TSH 3.64 06/03/2015   HGBA1C 6.3 05/13/2022   BP Readings from Last 3 Encounters:  07/13/22 134/82  05/13/22 134/82  05/10/22 (!) 152/81  The 10-year ASCVD risk score (Arnett DK, et al., 2019) is: 9.8%   Values used to calculate the  score:     Age: 28 years     Sex: Female     Is Non-Hispanic African American: No     Diabetic: No     Tobacco smoker: Yes     Systolic Blood Pressure: 134 mmHg     Is BP  treated: Yes     HDL Cholesterol: 36.9 mg/dL     Total Cholesterol: 193 mg/dL  Lab results reviewed with patient. ASSESSMENT AND PLAN:  Discussed the following assessment and plan:  Essential hypertension - Improved on valsartan 80 mg increased to 160 once a day or 80 twice daily trial to reassure goal - Plan: Basic metabolic panel, Basic metabolic panel  Tobacco use disorder - Counseled discussed  Medication management - Plan: Basic metabolic panel, Basic metabolic panel  Hyperlipidemia, unspecified hyperlipidemia type - With family history willing to go on medication trial begin Crestor 5 mg a day FLP in 3 to 4 months with other labs and then follow-up virtual orperson Blood pressure  improved ,personal blood pressure monitor correlates enough to use treatment decisions. -Patient advised to return or notify health care team  if  new concerns arise.  Patient Instructions  BP is getting better .  Goal below 130/80 average range  Increase the valsartan to 160 mg  per day  to get the plus life style. Adding  low dose crestor   5 mg per day    Plan lab in 3-4 months and ROV or [employer]. Zahli Vetsch M.D.

## 2022-10-19 ENCOUNTER — Ambulatory Visit: Payer: BC Managed Care – PPO | Admitting: Internal Medicine

## 2022-10-28 ENCOUNTER — Ambulatory Visit: Payer: BC Managed Care – PPO | Admitting: Internal Medicine

## 2022-10-28 ENCOUNTER — Ambulatory Visit (INDEPENDENT_AMBULATORY_CARE_PROVIDER_SITE_OTHER): Payer: BC Managed Care – PPO

## 2022-10-28 ENCOUNTER — Encounter: Payer: Self-pay | Admitting: Internal Medicine

## 2022-10-28 VITALS — BP 132/82 | HR 84 | Temp 97.9°F | Wt 243.8 lb

## 2022-10-28 DIAGNOSIS — I10 Essential (primary) hypertension: Secondary | ICD-10-CM

## 2022-10-28 DIAGNOSIS — R739 Hyperglycemia, unspecified: Secondary | ICD-10-CM | POA: Diagnosis not present

## 2022-10-28 DIAGNOSIS — M25649 Stiffness of unspecified hand, not elsewhere classified: Secondary | ICD-10-CM

## 2022-10-28 DIAGNOSIS — M79642 Pain in left hand: Secondary | ICD-10-CM

## 2022-10-28 DIAGNOSIS — Z79899 Other long term (current) drug therapy: Secondary | ICD-10-CM | POA: Diagnosis not present

## 2022-10-28 DIAGNOSIS — Z23 Encounter for immunization: Secondary | ICD-10-CM

## 2022-10-28 DIAGNOSIS — E785 Hyperlipidemia, unspecified: Secondary | ICD-10-CM

## 2022-10-28 DIAGNOSIS — M7989 Other specified soft tissue disorders: Secondary | ICD-10-CM | POA: Diagnosis not present

## 2022-10-28 DIAGNOSIS — M79641 Pain in right hand: Secondary | ICD-10-CM

## 2022-10-28 LAB — LIPID PANEL
Cholesterol: 146 mg/dL (ref 0–200)
HDL: 30.9 mg/dL — ABNORMAL LOW (ref >39.00)
LDL Cholesterol: 88 mg/dL (ref 0–99)
NonHDL: 114.75
Total CHOL/HDL Ratio: 5
Triglycerides: 134 mg/dL (ref 0.0–149.0)
VLDL: 26.8 mg/dL (ref 0.0–40.0)

## 2022-10-28 LAB — C-REACTIVE PROTEIN: CRP: 3.8 mg/dL (ref 0.5–20.0)

## 2022-10-28 LAB — CBC WITH DIFFERENTIAL/PLATELET
Basophils Absolute: 0.1 10*3/uL (ref 0.0–0.1)
Basophils Relative: 0.8 % (ref 0.0–3.0)
Eosinophils Absolute: 0.2 10*3/uL (ref 0.0–0.7)
Eosinophils Relative: 1.6 % (ref 0.0–5.0)
HCT: 39.8 % (ref 36.0–46.0)
Hemoglobin: 13 g/dL (ref 12.0–15.0)
Lymphocytes Relative: 29.4 % (ref 12.0–46.0)
Lymphs Abs: 4.3 10*3/uL — ABNORMAL HIGH (ref 0.7–4.0)
MCHC: 32.6 g/dL (ref 30.0–36.0)
MCV: 89 fl (ref 78.0–100.0)
Monocytes Absolute: 0.9 10*3/uL (ref 0.1–1.0)
Monocytes Relative: 6.2 % (ref 3.0–12.0)
Neutro Abs: 9.1 10*3/uL — ABNORMAL HIGH (ref 1.4–7.7)
Neutrophils Relative %: 62 % (ref 43.0–77.0)
Platelets: 333 10*3/uL (ref 150.0–400.0)
RBC: 4.47 Mil/uL (ref 3.87–5.11)
RDW: 13.5 % (ref 11.5–15.5)
WBC: 14.7 10*3/uL — ABNORMAL HIGH (ref 4.0–10.5)

## 2022-10-28 LAB — BASIC METABOLIC PANEL
BUN: 11 mg/dL (ref 6–23)
CO2: 29 mEq/L (ref 19–32)
Calcium: 9.4 mg/dL (ref 8.4–10.5)
Chloride: 105 mEq/L (ref 96–112)
Creatinine, Ser: 0.67 mg/dL (ref 0.40–1.20)
GFR: 98.08 mL/min (ref >60.00)
Glucose, Bld: 86 mg/dL (ref 70–99)
Potassium: 3.7 mEq/L (ref 3.5–5.1)
Sodium: 140 mEq/L (ref 135–145)

## 2022-10-28 LAB — POCT GLYCOSYLATED HEMOGLOBIN (HGB A1C): Hemoglobin A1C: 6.2 % — AB (ref 4.0–5.6)

## 2022-10-28 LAB — SEDIMENTATION RATE: Sed Rate: 63 mm/hr — ABNORMAL HIGH (ref 0–30)

## 2022-10-28 NOTE — Progress Notes (Signed)
Chief Complaint  Patient presents with   Follow-up    On a1c and BP   Hand Pain    Pt c/o hand pains on both side. Patient reports it is from arthritis and wants doctor to take a look at it. Going on for a while.     HPI: Susan Peters 56 y.o. come in for Chronic disease management   fu  Bp now on vlasartan 160  bp seems ok .  Fu a1c  Still tobacco 1ppd  Newer complaint  has had years of am hand stiffness  works with hands equipment  6 days per week  sleep with hands elevated   r hand dominant  but over last months is having more swelling and hand pain  over 2 and 3 mtp joints sometimes gets lumps    no specific injury   tender when flaring  Not taking meds   not asking to mask this but meed more evaluation Wants flu vaccine  ROS: See pertinent positives and negatives per HPI.  Off and on swelling  2 3 metacarpals  no unusual rashes  fevers.  Other joints   Family hx   mom hadregular arthritis   RA not known   Past Medical History:  Diagnosis Date   Anemia    Arthritis    Back pain    Hx of bacterial pneumonia    Migraine    long standing 3-4 x per month takes med    Family History  Problem Relation Age of Onset   Arthritis Mother    Cancer Mother        Uterus   Hyperlipidemia Mother    Stroke Mother    Heart disease Mother    Hypertension Mother    Diabetes Mother    Hyperlipidemia Father    Heart disease Father    Hypertension Father    Early death Father    Cancer Maternal Aunt    Hypertension Maternal Aunt    Diabetes Maternal Aunt    Heart disease Paternal Uncle    Cancer Maternal Aunt        Lung   Diabetes Maternal Aunt    Diabetes Cousin    Arthritis Maternal Grandmother    Stroke Maternal Grandmother    Hypertension Maternal Grandmother    Arthritis Maternal Grandfather    Heart disease Maternal Grandfather    Hypertension Maternal Grandfather    Arthritis Paternal Grandmother    Heart disease Paternal Grandmother    Hypertension Paternal  Grandmother    Diabetes Paternal Grandmother    Arthritis Paternal Grandfather    Heart disease Paternal Grandfather    Stroke Paternal Grandfather    Hypertension Paternal Grandfather    Kidney disease Paternal Grandfather     Social History   Socioeconomic History   Marital status: Single    Spouse name: Not on file   Number of children: Not on file   Years of education: Not on file   Highest education level: Bachelor's degree (e.g., BA, AB, BS)  Occupational History   Not on file  Tobacco Use   Smoking status: Every Day    Packs/day: 0.50    Types: Cigarettes   Smokeless tobacco: Never  Vaping Use   Vaping Use: Never used  Substance and Sexual Activity   Alcohol use: Yes    Alcohol/week: 0.0 standard drinks of alcohol    Comment: social   Drug use: No   Sexual activity: Not on file  Other Topics Concern   Not on file  Social History Narrative   7-8 hours of sleep per night   Lives with her roommate   Works full time as a Conservation officer, nature one step further    Holiday representative   2 Cats in the home [place] with father and visits    Net fa pos tob etoh    From [place]    Social Determinants of Health   Financial Resource Strain: Low Risk  (10/27/2022)   Overall Financial Resource Strain (CARDIA)    Difficulty of Paying Living Expenses: Not very hard  Food Insecurity: No Food Insecurity (10/27/2022)   Hunger Vital Sign    Worried About Running Out of Food in the Last Year: Never true    Ran Out of Food in the Last Year: Never true  Transportation Needs: No Transportation Needs (10/27/2022)   PRAPARE - Hydrologist (Medical): No    Lack of Transportation (Non-Medical): No  Physical Activity: Insufficiently Active (10/27/2022)   Exercise Vital Sign    Days of Exercise per Week: 1 day    Minutes of Exercise per Session: 30 min  Stress: No Stress Concern Present (10/27/2022)    [place]    Feeling of Stress : Only a little  Social Connections: Socially Integrated (10/27/2022)   Social Connection and Isolation Panel [NHANES]    Frequency of Communication with Friends and Family: More than three times a week    Frequency of Social Gatherings with Friends and Family: Once a week    Attends Religious Services: More than 4 times per year    Active Member of [employer] or Organizations: Yes    Attends Music therapist: More than 4 times per year    Marital Status: Living with partner    Outpatient Medications Prior to Visit  Medication Sig Dispense Refill   fluticasone (FLONASE) 50 MCG/ACT nasal spray Place 2 sprays into both nostrils daily. 16 g 6   guaiFENesin-codeine (ROBITUSSIN AC) 100-10 MG/5ML syrup Take 5 mLs by mouth 4 (four) times daily as needed for cough. At  night 120 mL 0   PROAIR HFA 108 (90 Base) MCG/ACT inhaler INHALE 2 PUFFS INTO THE LUNGS EVERY 6 HOURS AS NEEDED FOR WHEEZE OR SHORTNESS OF BREATH 8.5 Inhaler 1   promethazine-dextromethorphan (PROMETHAZINE-DM) 6.25-15 MG/5ML syrup Take 5 mLs by mouth 4 (four) times daily as needed for cough. At night 118 mL 0   rosuvastatin (CRESTOR) 5 MG tablet Take 1 tablet (5 mg total) by mouth daily. 90 tablet 1   valsartan (DIOVAN) 160 MG tablet Take 1 tablet (160 mg total) by mouth daily. 90 tablet 1   No facility-administered medications prior to visit.     EXAM:  BP 132/82 (BP Location: Right Arm, Patient Position: Sitting, Cuff Size: Large)   Pulse 84   Temp 97.9 F (36.6 C) (Oral)   Wt 243 lb 12.8 oz (110.6 kg)   LMP 11/25/2014   SpO2 95%   BMI 43.19 kg/m   Body mass index is 43.19 kg/m.  GENERAL: vitals reviewed and listed above, alert, oriented, appears well hydrated and in no acute distress HEENT: atraumatic, conjunctiva  clear, no obvious abnormalities on inspection of external nose and ears  NECK: no obvious  masses on inspection palpation  LUNGS: clear to auscultation bilaterally, no  wheezes, rales or rhonchi, good air movement CV: HRRR, no clubbing cyanosis or  peripheral edema nl cap refill  MS: moves all extremities  hand show  prominence  2 and 3 mcp bilaterally r more than left   pink but not hot  NV intact strength normal.  No deviation rom ok  PSYCH: pleasant and cooperative, no obvious depression or anxiety Lab Results  Component Value Date   WBC 15.5 (H) 05/13/2022   HGB 12.8 05/13/2022   HCT 38.9 05/13/2022   PLT 315.0 05/13/2022   GLUCOSE 93 07/13/2022   CHOL 193 05/13/2022   TRIG 129.0 05/13/2022   HDL 36.90 (L) 05/13/2022   LDLCALC 131 (H) 05/13/2022   ALT 22 05/09/2022   AST 22 05/09/2022   NA 140 07/13/2022   K 4.2 07/13/2022   CL 104 07/13/2022   CREATININE 0.68 07/13/2022   BUN 12 07/13/2022   CO2 27 07/13/2022   TSH 3.64 06/03/2015   HGBA1C 6.2 (A) 10/28/2022   BP Readings from Last 3 Encounters:  10/28/22 132/82  07/13/22 134/82  05/13/22 134/82    ASSESSMENT AND PLAN:  Discussed the following assessment and plan:  Essential hypertension - Plan: Basic metabolic panel, Basic metabolic panel  Hyperglycemia - Plan: POC HgB A1c, Lipid panel, CBC with Differential/Platelet, C-reactive protein, Sedimentation rate, Cyclic citrul peptide antibody, IgG, Rheumatoid Factor, ANA, ANA, Rheumatoid Factor, Cyclic citrul peptide antibody, IgG, Sedimentation rate, C-reactive protein, CBC with Differential/Platelet, Lipid panel  Bilateral hand pain - Plan: DG Hand 2 View Left, DG Hand 2 View Right, Lipid panel, CBC with Differential/Platelet, C-reactive protein, Sedimentation rate, Cyclic citrul peptide antibody, IgG, Rheumatoid Factor, ANA, ANA, Rheumatoid Factor, Cyclic citrul peptide antibody, IgG, Sedimentation rate, C-reactive protein, CBC with Differential/Platelet, Lipid panel  Morning joint stiffness of hand, unspecified laterality - Plan: DG Hand 2 View Left, DG Hand  2 View Right, Lipid panel, CBC with Differential/Platelet, C-reactive protein, Sedimentation rate, Cyclic citrul peptide antibody, IgG, Rheumatoid Factor, ANA, ANA, Rheumatoid Factor, Cyclic citrul peptide antibody, IgG, Sedimentation rate, C-reactive protein, CBC with Differential/Platelet, Lipid panel  Medication management - Plan: Lipid panel, CBC with Differential/Platelet, C-reactive protein, Sedimentation rate, Cyclic citrul peptide antibody, IgG, Rheumatoid Factor, ANA, ANA, Rheumatoid Factor, Cyclic citrul peptide antibody, IgG, Sedimentation rate, C-reactive protein, CBC with Differential/Platelet, Lipid panel  Hyperlipidemia, unspecified hyperlipidemia type - fasting today has been on crestor for 3 mos - Plan: Lipid panel, CBC with Differential/Platelet, C-reactive protein, Sedimentation rate, Cyclic citrul peptide antibody, IgG, Rheumatoid Factor, ANA, ANA, Rheumatoid Factor, Cyclic citrul peptide antibody, IgG, Sedimentation rate, C-reactive protein, CBC with Differential/Platelet, Lipid panel  Advise again tobacco  cessation  track $ per month  See instructions  -Patient advised to return or notify health care team  if  new concerns arise.  Patient Instructions  X ray and lab today    evaluating hand arthritis Can try topical  voltaren in interim  We may end up  doing a rheumatology consult.   Bp better on repeat.   A1c is 6.2    Trayvon Trumbull K. Axcel Horsch M.D.

## 2022-10-28 NOTE — Patient Instructions (Addendum)
X ray and lab today    evaluating hand arthritis Can try topical  voltaren in interim  We may end up  doing a rheumatology consult.   Bp better on repeat.   A1c is 6.2

## 2022-10-28 NOTE — Addendum Note (Signed)
Addended byEncarnacion Slates on: 10/28/2022 03:18 PM   Modules accepted: Orders

## 2022-10-31 LAB — RHEUMATOID FACTOR: Rheumatoid fact SerPl-aCnc: <14 IU/mL (ref <14)

## 2022-10-31 LAB — ANA: Anti Nuclear Antibody (ANA): NEGATIVE

## 2022-10-31 LAB — CYCLIC CITRUL PEPTIDE ANTIBODY, IGG: Cyclic Citrullin Peptide Ab: <16 UNITS

## 2022-11-01 NOTE — Progress Notes (Signed)
one inflammation marker is elevated  ( nonspecific and the other serology markers are negative   so no specific diagnosis .) Cholesterol is close to  goal.  With ldl 88 (goal around 70) Your WBC count is still elevated  uncertain cause  sometimes  can be from smoking but   seems higher and more persistent than  expected .   X rays as you can see show no bony changes   but soft tissue swelling ( non diagnostic)   Do not know if these to problems  could be connected.  So if you agree   refer to hematology for the elevated WBC .  And  rheumatology for the hand swelling and morning stiffness  to rheumatology   .

## 2022-11-01 NOTE — Progress Notes (Signed)
No bony changes  soft tissue swelling noted .

## 2022-11-01 NOTE — Progress Notes (Signed)
Swelling left ring finger  but no bony changes

## 2022-11-02 ENCOUNTER — Other Ambulatory Visit: Payer: Self-pay

## 2022-11-02 ENCOUNTER — Encounter: Payer: Self-pay | Admitting: Internal Medicine

## 2022-11-02 DIAGNOSIS — D72829 Elevated white blood cell count, unspecified: Secondary | ICD-10-CM

## 2022-11-02 DIAGNOSIS — M25649 Stiffness of unspecified hand, not elsewhere classified: Secondary | ICD-10-CM

## 2022-11-02 DIAGNOSIS — M79641 Pain in right hand: Secondary | ICD-10-CM

## 2022-11-04 ENCOUNTER — Telehealth: Payer: Self-pay | Admitting: Hematology and Oncology

## 2022-11-04 NOTE — Telephone Encounter (Signed)
Scheduled appointment per 11/07 referral. Patient is aware of appointment date and time. Patient is aware to arrive 15 mins prior to appointment time and to bring updated insurance cards. Patient is aware of location.   

## 2022-11-23 ENCOUNTER — Other Ambulatory Visit: Payer: Self-pay

## 2022-11-23 ENCOUNTER — Inpatient Hospital Stay: Payer: BC Managed Care – PPO

## 2022-11-23 ENCOUNTER — Inpatient Hospital Stay: Payer: BC Managed Care – PPO | Attending: Hematology and Oncology | Admitting: Hematology and Oncology

## 2022-11-23 VITALS — BP 148/80 | HR 77 | Temp 97.8°F | Resp 15 | Wt 244.6 lb

## 2022-11-23 DIAGNOSIS — D72829 Elevated white blood cell count, unspecified: Secondary | ICD-10-CM | POA: Insufficient documentation

## 2022-11-23 DIAGNOSIS — F17210 Nicotine dependence, cigarettes, uncomplicated: Secondary | ICD-10-CM | POA: Insufficient documentation

## 2022-11-23 DIAGNOSIS — D72825 Bandemia: Secondary | ICD-10-CM

## 2022-11-23 DIAGNOSIS — Z79899 Other long term (current) drug therapy: Secondary | ICD-10-CM | POA: Diagnosis not present

## 2022-11-23 LAB — CMP (CANCER CENTER ONLY)
ALT: 15 U/L (ref 0–44)
AST: 15 U/L (ref 15–41)
Albumin: 4.3 g/dL (ref 3.5–5.0)
Alkaline Phosphatase: 128 U/L — ABNORMAL HIGH (ref 38–126)
Anion gap: 7 (ref 5–15)
BUN: 12 mg/dL (ref 6–20)
CO2: 27 mmol/L (ref 22–32)
Calcium: 9.5 mg/dL (ref 8.9–10.3)
Chloride: 107 mmol/L (ref 98–111)
Creatinine: 0.61 mg/dL (ref 0.44–1.00)
GFR, Estimated: >60 mL/min (ref >60)
Glucose, Bld: 93 mg/dL (ref 70–99)
Potassium: 3.7 mmol/L (ref 3.5–5.1)
Sodium: 141 mmol/L (ref 135–145)
Total Bilirubin: 0.4 mg/dL (ref 0.3–1.2)
Total Protein: 7.3 g/dL (ref 6.5–8.1)

## 2022-11-23 LAB — CBC WITH DIFFERENTIAL (CANCER CENTER ONLY)
Abs Immature Granulocytes: 0.03 10*3/uL (ref 0.00–0.07)
Basophils Absolute: 0.1 10*3/uL (ref 0.0–0.1)
Basophils Relative: 1 %
Eosinophils Absolute: 0.3 10*3/uL (ref 0.0–0.5)
Eosinophils Relative: 2 %
HCT: 40.4 % (ref 36.0–46.0)
Hemoglobin: 12.8 g/dL (ref 12.0–15.0)
Immature Granulocytes: 0 %
Lymphocytes Relative: 32 %
Lymphs Abs: 4.5 10*3/uL — ABNORMAL HIGH (ref 0.7–4.0)
MCH: 29.4 pg (ref 26.0–34.0)
MCHC: 31.7 g/dL (ref 30.0–36.0)
MCV: 92.9 fL (ref 80.0–100.0)
Monocytes Absolute: 0.7 10*3/uL (ref 0.1–1.0)
Monocytes Relative: 5 %
Neutro Abs: 8.5 10*3/uL — ABNORMAL HIGH (ref 1.7–7.7)
Neutrophils Relative %: 60 %
Platelet Count: 224 10*3/uL (ref 150–400)
RBC: 4.35 MIL/uL (ref 3.87–5.11)
RDW: 13.6 % (ref 11.5–15.5)
WBC Count: 14.2 10*3/uL — ABNORMAL HIGH (ref 4.0–10.5)
nRBC: 0 % (ref 0.0–0.2)

## 2022-11-23 LAB — IRON AND IRON BINDING CAPACITY (CC-WL,HP ONLY)
Iron: 62 ug/dL (ref 28–170)
Saturation Ratios: 17 % (ref 10.4–31.8)
TIBC: 356 ug/dL (ref 250–450)
UIBC: 294 ug/dL (ref 148–442)

## 2022-11-23 LAB — SEDIMENTATION RATE: Sed Rate: 37 mm/hr — ABNORMAL HIGH (ref 0–22)

## 2022-11-23 LAB — C-REACTIVE PROTEIN: CRP: 3.6 mg/dL — ABNORMAL HIGH (ref <1.0)

## 2022-11-23 NOTE — Progress Notes (Signed)
[place] [phone]   Fax:([phone]) [place] NOTE  Patient Care Team: Burnis Medin, MD as PCP - General (Internal Medicine) Everlene Farrier, MD as Consulting Physician (Obstetrics and Gynecology)  Hematological/Oncological History # Leukocytosis, Neutrophilic Predominant.  10/28/2022: White blood cell 14.7, hemoglobin 30.0, MCV 89, and platelets 333.  Neutrophilia predominant with [place] 9100 11/23/2022: establish care with Dr. Lorenso Courier   CHIEF COMPLAINTS/PURPOSE OF CONSULTATION:  "Leukocytosis "  HISTORY OF PRESENTING ILLNESS:  Susan Peters 56 y.o. female with medical history significant for anemia, arthritis, migraine, and tobacco use who presents for evaluation of leukocytosis.   On review of the previous records Susan Peters has a longstanding history of leukocytosis dating back to at least 08/03/2014.  At that time showed a white blood cell count of 14.8 with an otherwise normal CBC.  Her leukocytosis is always a neutrophilic predominance.  Her last labs collected on 10/28/2022 showed white blood cell 14.7, hemoglobin 30.0, MCV 89, and platelets 333.  Neutrophilia predominant with [place] 9100  On exam today Susan Peters reports that she has had no recent issues with infections, rash, or other clear inflammatory processes.  She reports that she was just referred to rheumatology for swelling in her hands and swelled painful knees.  She reports that she has had increases in inflammatory markers.  She reports that her white blood cells have been elevated for as long as she can recall.  On further discussion she reports that she is a smoker smoking 1 pack/day for the last 30 years.  She reports that she wants to quit but her roommate is smoker which makes this more difficult.  She is considering different options for how to quit.  She reports that she does not snore but does not wake up throughout the night.  She does not have any daytime somnolence but does have  headaches occasionally.  She reports that she has had none recently.  She reports that she does not drink any alcohol and currently works for a nonprofit.  She notes her family history was markable for factor V Leiden in her maternal grandfather and maternal aunt.  She reports that she has many cousins with blood clots.  Her father had a heart attack at age 15.  She currently denies any fevers, chills, sweats, nausea, vomiting or diarrhea.  Full 10 point ROS was otherwise negative.  MEDICAL HISTORY:  Past Medical History:  Diagnosis Date   Anemia    Arthritis    Back pain    Hx of bacterial pneumonia    Migraine    long standing 3-4 x per month takes med    SURGICAL HISTORY: Past Surgical History:  Procedure Laterality Date   KNEE SURGERY Left    1982   SHOULDER SURGERY Left    1981    SOCIAL HISTORY: Social History   Socioeconomic History   Marital status: Single    Spouse name: Not on file   Number of children: Not on file   Years of education: Not on file   Highest education level: Bachelor's degree (e.g., BA, AB, BS)  Occupational History   Not on file  Tobacco Use   Smoking status: Every Day    Packs/day: 0.50    Types: Cigarettes   Smokeless tobacco: Never  Vaping Use   Vaping Use: Never used  Substance and Sexual Activity   Alcohol use: Yes    Alcohol/week: 0.0 standard drinks of alcohol    Comment:  social   Drug use: No   Sexual activity: Not on file  Other Topics Concern   Not on file  Social History Narrative   7-8 hours of sleep per night   Lives with her roommate   Works full time as a Conservation officer, nature one step further    Holiday representative   2 Cats in the home roomates    G1P1  Lives with father and visits    Net fa pos tob etoh    From [place]    Social Determinants of Health   Financial Resource Strain: Low Risk  (10/27/2022)   Overall Financial Resource Strain (CARDIA)    Difficulty of Paying  Living Expenses: Not very hard  Food Insecurity: No Food Insecurity (10/27/2022)   Hunger Vital Sign    Worried About Running Out of Food in the Last Year: Never true    Ran Out of Food in the Last Year: Never true  Transportation Needs: No Transportation Needs (10/27/2022)   PRAPARE - Hydrologist (Medical): No    Lack of Transportation (Non-Medical): No  Physical Activity: Insufficiently Active (10/27/2022)   Exercise Vital Sign    Days of Exercise per Week: 1 day    Minutes of Exercise per Session: 30 min  Stress: No Stress Concern Present (10/27/2022)   [place]    Feeling of Stress : Only a little  Social Connections: Socially Integrated (10/27/2022)   Social Connection and Isolation Panel [NHANES]    Frequency of Communication with Friends and Family: More than three times a week    Frequency of Social Gatherings with Friends and Family: Once a week    Attends Religious Services: More than 4 times per year    Active Member of [employer] or Organizations: Yes    Attends Music therapist: More than 4 times per year    Marital Status: Living with partner  Intimate Partner Violence: Not on file    FAMILY HISTORY: Family History  Problem Relation Age of Onset   Arthritis Mother    Cancer Mother        Uterus   Hyperlipidemia Mother    Stroke Mother    Heart disease Mother    Hypertension Mother    Diabetes Mother    Hyperlipidemia Father    Heart disease Father    Hypertension Father    Early death Father    Cancer Maternal Aunt    Hypertension Maternal Aunt    Diabetes Maternal Aunt    Heart disease Paternal Uncle    Cancer Maternal Aunt        Lung   Diabetes Maternal Aunt    Diabetes Cousin    Arthritis Maternal Grandmother    Stroke Maternal Grandmother    Hypertension Maternal Grandmother    Arthritis Maternal Grandfather    Heart disease Maternal  Grandfather    Hypertension Maternal Grandfather    Arthritis Paternal Grandmother    Heart disease Paternal Grandmother    Hypertension Paternal Grandmother    Diabetes Paternal Grandmother    Arthritis Paternal Grandfather    Heart disease Paternal Grandfather    Stroke Paternal Grandfather    Hypertension Paternal Grandfather    Kidney disease Paternal Grandfather     ALLERGIES:  has No Known Allergies.  MEDICATIONS:  Current Outpatient Medications  Medication Sig Dispense Refill   acetaminophen (  TYLENOL) 500 MG tablet Take 500 mg by mouth every 12 (twelve) hours as needed.     diclofenac Sodium (VOLTAREN) 1 % GEL Apply 2 g topically in the morning and at bedtime.     rosuvastatin (CRESTOR) 5 MG tablet Take 1 tablet (5 mg total) by mouth daily. 90 tablet 1   valsartan (DIOVAN) 160 MG tablet Take 1 tablet (160 mg total) by mouth daily. 90 tablet 1   No current facility-administered medications for this visit.    REVIEW OF SYSTEMS:   Constitutional: ( - ) fevers, ( - )  chills , ( - ) night sweats Eyes: ( - ) blurriness of vision, ( - ) double vision, ( - ) watery eyes Ears, nose, mouth, throat, and face: ( - ) mucositis, ( - ) sore throat Respiratory: ( - ) cough, ( - ) dyspnea, ( - ) wheezes Cardiovascular: ( - ) palpitation, ( - ) chest discomfort, ( - ) lower extremity swelling Gastrointestinal:  ( - ) nausea, ( - ) heartburn, ( - ) change in bowel habits Skin: ( - ) abnormal skin rashes Lymphatics: ( - ) new lymphadenopathy, ( - ) easy bruising Neurological: ( - ) numbness, ( - ) tingling, ( - ) new weaknesses Behavioral/Psych: ( - ) mood change, ( - ) new changes  All other systems were reviewed with the patient and are negative.  PHYSICAL EXAMINATION:  Vitals:   11/23/22 1331  BP: (!) 148/80  Pulse: 77  Resp: 15  Temp: 97.8 F (36.6 C)  SpO2: 97%   Filed Weights   11/23/22 1331  Weight: 244 lb 9.6 oz (110.9 kg)    GENERAL: well appearing middle-aged  Caucasian female in NAD  SKIN: skin color, texture, turgor are normal, no rashes or significant lesions EYES: conjunctiva are pink and non-injected, sclera clear LUNGS: clear to auscultation and percussion with normal breathing effort HEART: regular rate & rhythm and no murmurs and no lower extremity edema Musculoskeletal: no cyanosis of digits and no clubbing  PSYCH: alert & oriented x 3, fluent speech NEURO: no focal motor/sensory deficits  LABORATORY DATA:  I have reviewed the data as listed    Latest Ref Rng & Units 11/23/2022    2:25 PM 10/28/2022    1:25 PM 05/13/2022    3:26 PM  CBC  WBC 4.0 - 10.5 K/uL 14.2  14.7  15.5   Hemoglobin 12.0 - 15.0 g/dL 12.8  13.0  12.8   Hematocrit 36.0 - 46.0 % 40.4  39.8  38.9   Platelets 150 - 400 K/uL 224  333.0  315.0        Latest Ref Rng & Units 11/23/2022    2:25 PM 10/28/2022    1:25 PM 07/13/2022   12:28 PM  CMP  Glucose 70 - 99 mg/dL 93  86  93   BUN 6 - 20 mg/dL _0 Creatinine 0.44 - 1.00 mg/dL 0.61  0.67  0.68   Sodium 135 - 145 mmol/L 141  140  140   Potassium 3.5 - 5.1 mmol/L 3.7  3.7  4.2   Chloride 98 - 111 mmol/L 107  105  104   CO2 22 - 32 mmol/L _1 Calcium 8.9 - 10.3 mg/dL 9.5  9.4  9.4   Total Protein 6.5 - 8.1 g/dL 7.3     Total Bilirubin 0.3 - 1.2 mg/dL 0.4     Alkaline  Phos 38 - 126 U/L 128     AST 15 - 41 U/L 15     ALT 0 - 44 U/L 15        ASSESSMENT & PLAN Susan Peters 56 y.o. female with medical history significant for anemia, arthritis, migraine, and tobacco use who presents for evaluation of leukocytosis.   After review of the labs, review of the records, and discussion with the patient the patients findings are most consistent with leukocytosis of unclear etiology.  At this time it could be secondary to her smoking or an underlying inflammatory disorder.  She has establish care with rheumatology for further evaluation.  Neutrophilic predominant leukocytosis is a common hematological  finding with numerous possible etiologies. Possible causes include chronic inflammation, infection (transient or chronic), medication side effect (particularly steroids), myeloproliferative neoplasm, or idiopathic. The most common cause of chronic inflammation causing leukocytosis is cigarette smoking. Obstructive sleep apnea can also produce a similar pattern of leukocytosis. If there is no clear cause (or if there is an increase in other cell lines) a myeloproliferative neoplasm workup should be conducted with JAK2 w/ reflex and BCR/ABL FISH.  Idiopathic cases should be observed for stability.    # Leukocytosis, Neutrophilic Predominant  --today will order CBC, CMP, and LDH  --inflammatory workup with ESR and CRP  --encourage smoking cessation  --patient has no signs/symptoms of OSA other than snoring --no focal signs concerning for infection.   --will proceed with MPN workup to include JAK2 w/ reflex and BCR/ABL FISH   --RTC in 6 months or sooner if indicated by above workup.    Orders Placed This Encounter  Procedures   CBC with Differential ([facility] Only)    Standing Status:   Future    Number of Occurrences:   1    Standing Expiration Date:   11/24/2023   CMP ([facility] only)    Standing Status:   Future    Number of Occurrences:   1    Standing Expiration Date:   11/24/2023   Sedimentation rate    Standing Status:   Future    Number of Occurrences:   1    Standing Expiration Date:   11/23/2023   C-reactive protein    Standing Status:   Future    Number of Occurrences:   1    Standing Expiration Date:   11/23/2023   JAK2 (INCLUDING V617F AND EXON 12), MPL,& CALR W/RFL MPN PANEL ([facility])    Standing Status:   Future    Number of Occurrences:   1    Standing Expiration Date:   11/23/2023   BCR ABL1 FISH ([facility])    Standing Status:   Future    Number of Occurrences:   1    Standing Expiration Date:   11/24/2023   Ferritin    Standing Status:   Future    Number of  Occurrences:   1    Standing Expiration Date:   11/24/2023   Iron and Iron Binding Capacity ([facility] only)    Standing Status:   Future    Number of Occurrences:   1    Standing Expiration Date:   11/24/2023    All questions were answered. The patient knows to call the clinic with any problems, questions or concerns.  A total of more than 60 minutes were spent on this encounter with face-to-face time and non-face-to-face time, including preparing to see the patient, ordering tests and/or medications, counseling the patient and  coordination of care as outlined above.   Ledell Peoples, MD Department of Hematology/Oncology [facility] Phone: [phone] Pager: [phone] Email: [email or web address]  11/29/2022 6:34 PM

## 2022-11-24 LAB — FERRITIN: Ferritin: 126 ng/mL (ref 11–307)

## 2022-11-30 LAB — BCR ABL1 FISH (GENPATH)

## 2022-11-30 LAB — JAK2 (INCLUDING V617F AND EXON 12), MPL,& CALR W/RFL MPN PANEL (NGS)

## 2023-01-08 ENCOUNTER — Other Ambulatory Visit: Payer: Self-pay | Admitting: Internal Medicine

## 2023-02-10 NOTE — Progress Notes (Unsigned)
Office Visit Note  Patient: Susan Peters             Date of Birth: [birth year]           MRN: [id number]             PCP: Burnis Medin, MD Referring: Burnis Medin, MD Visit Date: 02/11/2023 Occupation: @GUAROCC$ @  Subjective:  No chief complaint on file.   History of Present Illness: Susan Peters is a 57 y.o. female here for evaluation of chronic hand swelling and prolonged morning joint stiffness and knee pain.***   Labs reviewed 10/2022 ANA neg RF neg CCP neg ESR 63 CRP 3.8 WBC 14.7   Activities of Daily Living:  Patient reports morning stiffness for *** {minute/hour:19697}.   Patient {ACTIONS;DENIES/REPORTS:21021675::"Denies"} nocturnal pain.  Difficulty dressing/grooming: {ACTIONS;DENIES/REPORTS:21021675::"Denies"} Difficulty climbing stairs: {ACTIONS;DENIES/REPORTS:21021675::"Denies"} Difficulty getting out of chair: {ACTIONS;DENIES/REPORTS:21021675::"Denies"} Difficulty using hands for taps, buttons, cutlery, and/or writing: {ACTIONS;DENIES/REPORTS:21021675::"Denies"}  No Rheumatology ROS completed.   PMFS History:  Patient Active Problem List   Diagnosis Date Noted   Essential hypertension 05/13/2022   Elevated alkaline phosphatase level 06/10/2015   Sciatica 11/27/2014   Tobacco use disorder 11/27/2014   Frequent headaches 11/27/2014   Family history of diabetes mellitus (DM) 11/27/2014   Family history of heart disease 11/27/2014    Past Medical History:  Diagnosis Date   Anemia    Arthritis    Back pain    Hx of bacterial pneumonia    Migraine    long standing 3-4 x per month takes med    Family History  Problem Relation Age of Onset   Arthritis Mother    Cancer Mother        Uterus   Hyperlipidemia Mother    Stroke Mother    Heart disease Mother    Hypertension Mother    Diabetes Mother    Hyperlipidemia Father    Heart disease Father    Hypertension Father    Early death Father    Cancer Maternal Aunt    Hypertension Maternal  Aunt    Diabetes Maternal Aunt    Heart disease Paternal Uncle    Cancer Maternal Aunt        Lung   Diabetes Maternal Aunt    Diabetes Cousin    Arthritis Maternal Grandmother    Stroke Maternal Grandmother    Hypertension Maternal Grandmother    Arthritis Maternal Grandfather    Heart disease Maternal Grandfather    Hypertension Maternal Grandfather    Arthritis Paternal Grandmother    Heart disease Paternal Grandmother    Hypertension Paternal Grandmother    Diabetes Paternal Grandmother    Arthritis Paternal Grandfather    Heart disease Paternal Grandfather    Stroke Paternal Grandfather    Hypertension Paternal Grandfather    Kidney disease Paternal Grandfather    Past Surgical History:  Procedure Laterality Date   KNEE SURGERY Left    1982   SHOULDER SURGERY Left    1981   Social History   Social History Narrative   7-8 hours of sleep per night   Lives with her roommate   Works full time as a Conservation officer, nature one step further    Holiday representative   2 Cats in the home roomates    G1P1  Lives with father and visits    Net fa pos tob etoh    [place]    Immunization  History  Administered Date(s) Administered   Influenza,inj,Quad PF,6+ Mos 11/27/2014, 10/15/2015, 11/15/2019, 10/28/2022   Influenza,inj,quad, With Preservative 09/27/2018   Tdap 11/27/2014     Objective: Vital Signs: LMP 11/25/2014    Physical Exam   Musculoskeletal Exam: ***  CDAI Exam: CDAI Score: -- Patient Global: --; Provider Global: -- Swollen: --; Tender: -- Joint Exam 02/11/2023   No joint exam has been documented for this visit   There is currently no information documented on the homunculus. Go to the Rheumatology activity and complete the homunculus joint exam.  Investigation: No additional findings.  Imaging: No results found.  Recent Labs: Lab Results  Component Value Date   WBC 14.2 (H) 11/23/2022   HGB 12.8  11/23/2022   PLT 224 11/23/2022   NA 141 11/23/2022   K 3.7 11/23/2022   CL 107 11/23/2022   CO2 27 11/23/2022   GLUCOSE 93 11/23/2022   BUN 12 11/23/2022   CREATININE 0.61 11/23/2022   BILITOT 0.4 11/23/2022   ALKPHOS 128 (H) 11/23/2022   AST 15 11/23/2022   ALT 15 11/23/2022   PROT 7.3 11/23/2022   ALBUMIN 4.3 11/23/2022   CALCIUM 9.5 11/23/2022   GFRAA >60 07/05/2019    Speciality Comments: No specialty comments available.  Procedures:  No procedures performed Allergies: Patient has no known allergies.   Assessment / Plan:     Visit Diagnoses: No diagnosis found.  Orders: No orders of the defined types were placed in this encounter.  No orders of the defined types were placed in this encounter.   Face-to-face time spent with patient was *** minutes. Greater than 50% of time was spent in counseling and coordination of care.  Follow-Up Instructions: No follow-ups on file.   Collier Salina, MD  Note - This record has been created using [employer].  Chart creation errors have been sought, but may not always  have been located. Such creation errors do not reflect on  the standard of medical care.

## 2023-02-11 ENCOUNTER — Encounter: Payer: Self-pay | Admitting: Internal Medicine

## 2023-02-11 ENCOUNTER — Ambulatory Visit: Payer: BC Managed Care – PPO | Attending: Internal Medicine | Admitting: Internal Medicine

## 2023-02-11 VITALS — BP 130/87 | HR 74 | Resp 16 | Ht 63.5 in | Wt 240.0 lb

## 2023-02-11 DIAGNOSIS — M160 Bilateral primary osteoarthritis of hip: Secondary | ICD-10-CM | POA: Insufficient documentation

## 2023-02-11 DIAGNOSIS — R899 Unspecified abnormal finding in specimens from other organs, systems and tissues: Secondary | ICD-10-CM | POA: Insufficient documentation

## 2023-02-11 DIAGNOSIS — M79641 Pain in right hand: Secondary | ICD-10-CM | POA: Insufficient documentation

## 2023-02-11 DIAGNOSIS — R700 Elevated erythrocyte sedimentation rate: Secondary | ICD-10-CM

## 2023-02-11 DIAGNOSIS — M79642 Pain in left hand: Secondary | ICD-10-CM

## 2023-02-14 LAB — PROTEIN ELECTROPHORESIS, SERUM
Albumin ELP: 4 g/dL (ref 3.8–4.8)
Alpha 1: 0.4 g/dL — ABNORMAL HIGH (ref 0.2–0.3)
Alpha 2: 0.9 g/dL (ref 0.5–0.9)
Beta 2: 0.4 g/dL (ref 0.2–0.5)
Beta Globulin: 0.5 g/dL (ref 0.4–0.6)
Gamma Globulin: 1 g/dL (ref 0.8–1.7)
Total Protein: 7.1 g/dL (ref 6.1–8.1)

## 2023-02-14 LAB — SEDIMENTATION RATE: Sed Rate: 36 mm/h — ABNORMAL HIGH (ref 0–30)

## 2023-02-14 LAB — C3 AND C4
C3 Complement: 194 mg/dL — ABNORMAL HIGH (ref 83–193)
C4 Complement: 33 mg/dL (ref 15–57)

## 2023-02-14 LAB — SJOGRENS SYNDROME-A EXTRACTABLE NUCLEAR ANTIBODY: SSA (Ro) (ENA) Antibody, IgG: <1 AI

## 2023-02-14 LAB — C-REACTIVE PROTEIN: CRP: 34.1 mg/L — ABNORMAL HIGH (ref <8.0)

## 2023-02-15 NOTE — Progress Notes (Signed)
Sed rate is slightly increased at 36 and CRP is very increased at 34. This indicates some ongoing inflammation. Rechecking the low positive sjogren's syndrome antibody was negative. Protein electrophoresis also consistent with inflammation no sign of malignancy.

## 2023-03-11 ENCOUNTER — Ambulatory Visit: Payer: BC Managed Care – PPO | Attending: Internal Medicine | Admitting: Internal Medicine

## 2023-03-11 ENCOUNTER — Encounter: Payer: Self-pay | Admitting: Internal Medicine

## 2023-03-11 VITALS — BP 132/82 | HR 78 | Resp 12 | Ht 63.0 in | Wt 243.0 lb

## 2023-03-11 DIAGNOSIS — M160 Bilateral primary osteoarthritis of hip: Secondary | ICD-10-CM | POA: Diagnosis not present

## 2023-03-11 DIAGNOSIS — R700 Elevated erythrocyte sedimentation rate: Secondary | ICD-10-CM

## 2023-03-11 DIAGNOSIS — M79642 Pain in left hand: Secondary | ICD-10-CM | POA: Diagnosis not present

## 2023-03-11 DIAGNOSIS — M79641 Pain in right hand: Secondary | ICD-10-CM

## 2023-03-11 NOTE — Progress Notes (Signed)
Office Visit Note  Patient: Susan Peters             Date of Birth: [birth year]           MRN: [id number]             PCP: Burnis Medin, MD Referring: Burnis Medin, MD Visit Date: 03/11/2023   Subjective:  Follow-up   History of Present Illness: Susan Peters is a 57 y.o. female here for follow up for joint pain and swelling with elevated sedum markers of inflammation. Since last visit her symptoms are stable if anything slightly better, currently using topical voltaren about once daily and takes tylenol or ibuprofen about once weekly. Right side of thigh remains numb chronically no new worsening of sciatica.   Previous HPI 12/12/23 TIERAH Peters is a 57 y.o. female here for evaluation of chronic hand swelling and prolonged morning joint stiffness and knee pain.  She has very longstanding bilateral hip osteoarthritis known since decades ago.  Does not know if these areas of really had any worsening since she is used to a considerable amount of pain with activities on a daily basis.  That hand swelling started since around November of last year.  Does not recall any preceding illness injury or medication change.  She noticed swelling pretty diffusely throughout the fingers of both hands.  Especially worse overnight and first thing in the mornings.  Topical Voltaren has been very helpful and she still using this which decreases the swelling almost completely.  Symptoms have gradually declined since the initial onset November but still gets right hand overnight swelling and pain if she does not sleep with this elevated.  Denies any significant numbness tingling or loss of grip strength.  Does not see any rashes or discoloration.  No increase in swelling elsewhere.     Labs reviewed 10/2022 ANA neg RF neg CCP neg ESR 63 CRP 3.8 WBC 14.7   Review of Systems  Constitutional:  Positive for fatigue.  HENT:  Negative for mouth sores and mouth dryness.   Eyes:  Negative for dryness.   Respiratory:  Negative for shortness of breath.   Cardiovascular:  Positive for palpitations. Negative for chest pain.  Gastrointestinal:  Negative for blood in stool, constipation and diarrhea.  Endocrine: Negative for increased urination.  Genitourinary:  Negative for involuntary urination.  Musculoskeletal:  Positive for joint pain, joint pain, joint swelling and morning stiffness. Negative for gait problem, myalgias, muscle weakness, muscle tenderness and myalgias.  Skin:  Negative for color change, rash, hair loss and sensitivity to sunlight.  Allergic/Immunologic: Negative for susceptible to infections.  Neurological:  Positive for headaches. Negative for dizziness.  Hematological:  Negative for swollen glands.  Psychiatric/Behavioral:  Negative for depressed mood and sleep disturbance. The patient is not nervous/anxious.     PMFS History:  Patient Active Problem List   Diagnosis Date Noted   Sedimentation rate elevation 02/11/2023   Bilateral hand pain 02/11/2023   Bilateral primary osteoarthritis of hip 02/11/2023   Abnormal laboratory test result 02/11/2023   Essential hypertension 05/13/2022   Elevated alkaline phosphatase level 06/10/2015   Sciatica 11/27/2014   Tobacco use disorder 11/27/2014   Frequent headaches 11/27/2014   Family history of diabetes mellitus (DM) 11/27/2014   Family history of heart disease 11/27/2014    Past Medical History:  Diagnosis Date   Anemia    Arthritis    Back pain    Hx of bacterial pneumonia  Migraine    long standing 3-4 x per month takes med    Family History  Problem Relation Age of Onset   Arthritis Mother    Cancer Mother        Uterus   Hyperlipidemia Mother    Stroke Mother    Heart disease Mother    Hypertension Mother    Diabetes Mother    Hyperlipidemia Father    Heart disease Father    Hypertension Father    Early death Father    Rheum arthritis Sister    Arthritis Maternal Grandmother    Stroke Maternal  Grandmother    Hypertension Maternal Grandmother    Arthritis Maternal Grandfather    Heart disease Maternal Grandfather    Hypertension Maternal Grandfather    Arthritis Paternal Grandmother    Heart disease Paternal Grandmother    Hypertension Paternal Grandmother    Diabetes Paternal Grandmother    Arthritis Paternal Grandfather    Heart disease Paternal Grandfather    Stroke Paternal Grandfather    Hypertension Paternal Grandfather    Kidney disease Paternal Grandfather    Cancer Maternal Aunt    Hypertension Maternal Aunt    Diabetes Maternal Aunt    Arthritis/Rheumatoid Maternal Aunt    Cancer Maternal Aunt        Lung   Diabetes Maternal Aunt    Heart disease Paternal Uncle    Diabetes Cousin    Past Surgical History:  Procedure Laterality Date   ABDOMINAL HYSTERECTOMY  01/2015   KNEE SURGERY Left    1982   KNEE SURGERY Left 2019   KNEE SURGERY Right 2021   SHOULDER SURGERY Left    1981   Social History   Social History Narrative   7-8 hours of sleep per night   Lives with her roommate   Works full time as a Conservation officer, nature one step further    Holiday representative   2 Cats in the home roomates    G1P1  Lives with father and visits    Net fa pos tob etoh    [place]   [employer]  [place]    Immunization History  Administered Date(s) Administered   Influenza,inj,Quad PF,6+ Mos 11/27/2014, 10/15/2015, 11/15/2019, 10/28/2022   Influenza,inj,quad, With Preservative 09/27/2018   Tdap 11/27/2014     Objective: Vital Signs: BP 132/82 (BP Location: Left Arm, Patient Position: Sitting, Cuff Size: Normal)   Pulse 78   Resp 12   Ht '5\' 3"'$  (1.6 m)   Wt 243 lb (110.2 kg)   LMP 11/25/2014   BMI 43.05 kg/m    Physical Exam Constitutional:      Appearance: She is obese.  Cardiovascular:     Rate and Rhythm: Normal rate and regular rhythm.  Pulmonary:     Effort: Pulmonary effort is normal.     Breath sounds: Normal breath sounds.   Skin:    General: Skin is warm and dry.     Findings: No rash.     Comments: Dry skin  Neurological:     Mental Status: She is alert.  Psychiatric:        Mood and Affect: Mood normal.      Musculoskeletal Exam:  Shoulders full ROM no tenderness or swelling Elbows full ROM no tenderness or swelling Wrists full ROM no tenderness or swelling Fingers full ROM no tenderness or swelling Slightly restricted hip internal rotation without pain, no lateral hip tenderness Knees full ROM no tenderness  or swelling crpeitus b/l Ankles full ROM no tenderness or swelling  Investigation: No additional findings.  Imaging: No results found.  Recent Labs: Lab Results  Component Value Date   WBC 14.2 (H) 11/23/2022   HGB 12.8 11/23/2022   PLT 224 11/23/2022   NA 141 11/23/2022   K 3.7 11/23/2022   CL 107 11/23/2022   CO2 27 11/23/2022   GLUCOSE 93 11/23/2022   BUN 12 11/23/2022   CREATININE 0.61 11/23/2022   BILITOT 0.4 11/23/2022   ALKPHOS 128 (H) 11/23/2022   AST 15 11/23/2022   ALT 15 11/23/2022   PROT 7.1 02/11/2023   ALBUMIN 4.3 11/23/2022   CALCIUM 9.5 11/23/2022   GFRAA >60 07/05/2019    Speciality Comments: No specialty comments available.  Procedures:  No procedures performed Allergies: Patient has no known allergies.   Assessment / Plan:     Visit Diagnoses: Sedimentation rate elevation Bilateral hand pain  Previous joint swelling with negative antibody markers but sed rate and CRP were highly elevated.  I do not see any peripheral synovitis on exam and with previous benign ultrasound exam.  Do not recommend starting any DMARD treatment today.  I recommend we should keep some scheduled follow-up for monitoring but would be most useful to see her again if increase swelling episode occurs like last year.  Bilateral primary osteoarthritis of hip  Provided printed recommendations on some supplements or localized therapy options for chronic osteoarthritis joint pain.   She had questions about use of NSAIDs while on losartan for blood pressure.  I agreed this would be concerning for regular use but probably okay if taking ibuprofen once a week.  Orders: No orders of the defined types were placed in this encounter.  No orders of the defined types were placed in this encounter.    Follow-Up Instructions: Return in about 1 year (around 03/10/2024), or if symptoms worsen or fail to improve, for Inflammatory arthritis obs/NSAIDs f/u 67yr   CCollier Salina MD  Note - This record has been created using [employer]  Chart creation errors have been sought, but may not always  have been located. Such creation errors do not reflect on  the standard of medical care.

## 2023-03-11 NOTE — Patient Instructions (Signed)

## 2023-04-04 ENCOUNTER — Other Ambulatory Visit: Payer: Self-pay | Admitting: Internal Medicine

## 2023-05-11 ENCOUNTER — Telehealth: Payer: Self-pay | Admitting: Hematology and Oncology

## 2023-05-31 ENCOUNTER — Other Ambulatory Visit: Payer: BC Managed Care – PPO

## 2023-05-31 ENCOUNTER — Ambulatory Visit: Payer: BC Managed Care – PPO | Admitting: Hematology and Oncology

## 2023-06-21 ENCOUNTER — Other Ambulatory Visit: Payer: Self-pay | Admitting: Hematology and Oncology

## 2023-06-21 ENCOUNTER — Inpatient Hospital Stay: Payer: BC Managed Care – PPO | Admitting: Hematology and Oncology

## 2023-06-21 ENCOUNTER — Other Ambulatory Visit: Payer: Self-pay

## 2023-06-21 ENCOUNTER — Inpatient Hospital Stay: Payer: BC Managed Care – PPO | Attending: Hematology and Oncology

## 2023-06-21 VITALS — BP 142/70 | HR 77 | Temp 97.7°F | Resp 15 | Wt 244.4 lb

## 2023-06-21 DIAGNOSIS — D72829 Elevated white blood cell count, unspecified: Secondary | ICD-10-CM | POA: Diagnosis not present

## 2023-06-21 DIAGNOSIS — F17210 Nicotine dependence, cigarettes, uncomplicated: Secondary | ICD-10-CM | POA: Diagnosis not present

## 2023-06-21 DIAGNOSIS — D72825 Bandemia: Secondary | ICD-10-CM

## 2023-06-21 DIAGNOSIS — Z832 Family history of diseases of the blood and blood-forming organs and certain disorders involving the immune mechanism: Secondary | ICD-10-CM | POA: Diagnosis not present

## 2023-06-21 LAB — CMP (CANCER CENTER ONLY)
ALT: 13 U/L (ref 0–44)
AST: 11 U/L — ABNORMAL LOW (ref 15–41)
Albumin: 3.8 g/dL (ref 3.5–5.0)
Alkaline Phosphatase: 120 U/L (ref 38–126)
Anion gap: 6 (ref 5–15)
BUN: 14 mg/dL (ref 6–20)
CO2: 26 mmol/L (ref 22–32)
Calcium: 9.1 mg/dL (ref 8.9–10.3)
Chloride: 108 mmol/L (ref 98–111)
Creatinine: 0.77 mg/dL (ref 0.44–1.00)
GFR, Estimated: >60 mL/min (ref >60)
Glucose, Bld: 184 mg/dL — ABNORMAL HIGH (ref 70–99)
Potassium: 3.9 mmol/L (ref 3.5–5.1)
Sodium: 140 mmol/L (ref 135–145)
Total Bilirubin: 0.2 mg/dL — ABNORMAL LOW (ref 0.3–1.2)
Total Protein: 6.6 g/dL (ref 6.5–8.1)

## 2023-06-21 LAB — CBC WITH DIFFERENTIAL (CANCER CENTER ONLY)
Abs Immature Granulocytes: 0.09 10*3/uL — ABNORMAL HIGH (ref 0.00–0.07)
Basophils Absolute: 0.1 10*3/uL (ref 0.0–0.1)
Basophils Relative: 1 %
Eosinophils Absolute: 0.4 10*3/uL (ref 0.0–0.5)
Eosinophils Relative: 2 %
HCT: 41.7 % (ref 36.0–46.0)
Hemoglobin: 13.3 g/dL (ref 12.0–15.0)
Immature Granulocytes: 1 %
Lymphocytes Relative: 25 %
Lymphs Abs: 4.4 10*3/uL — ABNORMAL HIGH (ref 0.7–4.0)
MCH: 29 pg (ref 26.0–34.0)
MCHC: 31.9 g/dL (ref 30.0–36.0)
MCV: 91 fL (ref 80.0–100.0)
Monocytes Absolute: 0.8 10*3/uL (ref 0.1–1.0)
Monocytes Relative: 5 %
Neutro Abs: 12 10*3/uL — ABNORMAL HIGH (ref 1.7–7.7)
Neutrophils Relative %: 66 %
Platelet Count: 346 10*3/uL (ref 150–400)
RBC: 4.58 MIL/uL (ref 3.87–5.11)
RDW: 13.3 % (ref 11.5–15.5)
WBC Count: 17.9 10*3/uL — ABNORMAL HIGH (ref 4.0–10.5)
nRBC: 0 % (ref 0.0–0.2)

## 2023-06-21 NOTE — Progress Notes (Signed)
[facility] Telephone:([phone]   Fax:([phone]  PROGRESS NOTE  Patient Care Team: Madelin Headings, MD as PCP - General (Internal Medicine) Harold Hedge, MD as Consulting Physician (Obstetrics and Gynecology)  Hematological/Oncological History # Leukocytosis, Neutrophilic Predominant.  10/28/2022: White blood cell 14.7, hemoglobin 30.0, MCV 89, and platelets 333.  Neutrophilia predominant with ANC 9100 11/23/2022: establish care with Dr. Leonides Schanz.  Noted to have inflammatory markers and referred to rheumatology.  Interval History:  Susan Peters 57 y.o. female with medical history significant for leukocytosis likely secondary to inflammation who presents for a follow up visit. The patient's last visit was on 11/23/2022. In the interim since the last visit she has established with Dr. Dimple Casey in rheumatology.  On exam today [name] reports that she has been well in the interim since her last visit.  She notes that her energy levels are good/normal though she can be tired after a long day at work.  She has had no infectious symptoms since our last visit.  She denies any runny nose, sore throat, cough.  She notes that she has not been having any trouble with fevers, chills, sweats, nausea, vomiting or diarrhea.  She is currently taking over-the-counter anti-inflammatories for her arthritis.  She does that she has not experienced any weight loss and is eating well.  Today she did wish to discuss her family history of factor V Leiden.  She reports that her maternal grandfather and maternal aunt both had this issue.  Her mother did test positive for the gene but has not had blood clots.  MEDICAL HISTORY:  Past Medical History:  Diagnosis Date   Anemia    Arthritis    Back pain    Hx of bacterial pneumonia    Migraine    long standing 3-4 x per month takes med    SURGICAL HISTORY: Past Surgical History:  Procedure Laterality Date   ABDOMINAL HYSTERECTOMY  01/2015    KNEE SURGERY Left    1982   KNEE SURGERY Left 2019   KNEE SURGERY Right 2021   SHOULDER SURGERY Left    1981    SOCIAL HISTORY: Social History   Socioeconomic History   Marital status: Single    Spouse name: Not on file   Number of children: Not on file   Years of education: Not on file   Highest education level: Bachelor's degree (e.g., BA, AB, BS)  Occupational History   Not on file  Tobacco Use   Smoking status: Every Day    Packs/day: .5    Types: Cigarettes   Smokeless tobacco: Never  Vaping Use   Vaping Use: Never used  Substance and Sexual Activity   Alcohol use: Yes    Alcohol/week: 0.0 standard drinks of alcohol    Comment: social   Drug use: No   Sexual activity: Not on file  Other Topics Concern   Not on file  Social History Narrative   7-8 hours of sleep per night   Lives with her roommate   Works full time as a Education officer, 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 in the home roomates    G1P1  Lives with father and visits    Net fa pos tob etoh    [place]   [employer]  [place]    Social Determinants of Health   Financial Resource Strain: Low Risk  (10/27/2022)   Overall Physicist, medical  Strain (CARDIA)    Difficulty of Paying Living Expenses: Not very hard  Food Insecurity: No Food Insecurity (10/27/2022)   Hunger Vital Sign    Worried About Running Out of Food in the Last Year: Never true    Ran Out of Food in the Last Year: Never true  Transportation Needs: No Transportation Needs (10/27/2022)   PRAPARE - Administrator, Civil Service (Medical): No    Lack of Transportation (Non-Medical): No  Physical Activity: Insufficiently Active (10/27/2022)   Exercise Vital Sign    Days of Exercise per Week: 1 day    Minutes of Exercise per Session: 30 min  Stress: No Stress Concern Present (10/27/2022)   [employer] of Occupational Health - Occupational Stress Questionnaire    Feeling of Stress :  Only a little  Social Connections: Socially Integrated (10/27/2022)   Social Connection and Isolation Panel [NHANES]    Frequency of Communication with Friends and Family: More than three times a week    Frequency of Social Gatherings with Friends and Family: Once a week    Attends Religious Services: More than 4 times per year    Active Member of [employer] or Organizations: Yes    Attends Engineer, structural: More than 4 times per year    Marital Status: Living with partner  Intimate Partner Violence: Not on file    FAMILY HISTORY: Family History  Problem Relation Age of Onset   Arthritis Mother    Cancer Mother        Uterus   Hyperlipidemia Mother    Stroke Mother    Heart disease Mother    Hypertension Mother    Diabetes Mother    Hyperlipidemia Father    Heart disease Father    Hypertension Father    Early death Father    Rheum arthritis Sister    Arthritis Maternal Grandmother    Stroke Maternal Grandmother    Hypertension Maternal Grandmother    Arthritis Maternal Grandfather    Heart disease Maternal Grandfather    Hypertension Maternal Grandfather    Arthritis Paternal Grandmother    Heart disease Paternal Grandmother    Hypertension Paternal Grandmother    Diabetes Paternal Grandmother    Arthritis Paternal Grandfather    Heart disease Paternal Grandfather    Stroke Paternal Grandfather    Hypertension Paternal Grandfather    Kidney disease Paternal Grandfather    Cancer Maternal Aunt    Hypertension Maternal Aunt    Diabetes Maternal Aunt    Arthritis/Rheumatoid Maternal Aunt    Cancer Maternal Aunt        Lung   Diabetes Maternal Aunt    Heart disease Paternal Uncle    Diabetes Cousin     ALLERGIES:  has No Known Allergies.  MEDICATIONS:  Current Outpatient Medications  Medication Sig Dispense Refill   acetaminophen (TYLENOL) 500 MG tablet Take 500 mg by mouth every 12 (twelve) hours as needed.     diclofenac Sodium (VOLTAREN) 1 % GEL  Apply 2 g topically in the morning and at bedtime.     rosuvastatin (CRESTOR) 5 MG tablet TAKE 1 TABLET (5 MG TOTAL) BY MOUTH DAILY. 90 tablet 0   valsartan (DIOVAN) 160 MG tablet TAKE 1 TABLET BY MOUTH EVERY DAY 90 tablet 0   No current facility-administered medications for this visit.    REVIEW OF SYSTEMS:   Constitutional: ( - ) fevers, ( - )  chills , ( - ) night sweats Eyes: ( - )  blurriness of vision, ( - ) double vision, ( - ) watery eyes Ears, nose, mouth, throat, and face: ( - ) mucositis, ( - ) sore throat Respiratory: ( - ) cough, ( - ) dyspnea, ( - ) wheezes Cardiovascular: ( - ) palpitation, ( - ) chest discomfort, ( - ) lower extremity swelling Gastrointestinal:  ( - ) nausea, ( - ) heartburn, ( - ) change in bowel habits Skin: ( - ) abnormal skin rashes Lymphatics: ( - ) new lymphadenopathy, ( - ) easy bruising Neurological: ( - ) numbness, ( - ) tingling, ( - ) new weaknesses Behavioral/Psych: ( - ) mood change, ( - ) new changes  All other systems were reviewed with the patient and are negative.  PHYSICAL EXAMINATION:  Vitals:   06/21/23 0812  BP: (!) 142/70  Pulse: 77  Resp: 15  Temp: 97.7 F (36.5 C)  SpO2: 95%   Filed Weights   06/21/23 0812  Weight: 244 lb 6.4 oz (110.9 kg)    GENERAL: Well-appearing middle-aged Caucasian female, alert, no distress and comfortable SKIN: skin color, texture, turgor are normal, no rashes or significant lesions EYES: conjunctiva are pink and non-injected, sclera clear LUNGS: clear to auscultation and percussion with normal breathing effort HEART: regular rate & rhythm and no murmurs and no lower extremity edema Musculoskeletal: no cyanosis of digits and no clubbing  PSYCH: alert & oriented x 3, fluent speech NEURO: no focal motor/sensory deficits  LABORATORY DATA:  I have reviewed the data as listed    Latest Ref Rng & Units 06/21/2023    7:36 AM 11/23/2022    2:25 PM 10/28/2022    1:25 PM  CBC  WBC 4.0 - 10.5 K/uL  17.9  14.2  14.7   Hemoglobin 12.0 - 15.0 g/dL 29.5  28.4  13.2   Hematocrit 36.0 - 46.0 % 41.7  40.4  39.8   Platelets 150 - 400 K/uL 346  224  333.0        Latest Ref Rng & Units 06/21/2023    7:36 AM 02/11/2023   10:33 AM 11/23/2022    2:25 PM  CMP  Glucose 70 - 99 mg/dL 440   93   BUN 6 - 20 mg/dL 14   12   Creatinine 1.02 - 1.00 mg/dL 7.25   3.66   Sodium 440 - 145 mmol/L 140   141   Potassium 3.5 - 5.1 mmol/L 3.9   3.7   Chloride 98 - 111 mmol/L 108   107   CO2 22 - 32 mmol/L 26   27   Calcium 8.9 - 10.3 mg/dL 9.1   9.5   Total Protein 6.5 - 8.1 g/dL 6.6  7.1  7.3   Total Bilirubin 0.3 - 1.2 mg/dL 0.2   0.4   Alkaline Phos 38 - 126 U/L 120   128   AST 15 - 41 U/L 11   15   ALT 0 - 44 U/L 13   15     RADIOGRAPHIC STUDIES: No results found.  ASSESSMENT & PLAN Susan Peters 57 y.o. female with medical history significant for leukocytosis likely secondary to inflammation who presents for a follow up visit.  # Leukocytosis 2/2 to Inflammation  -- Extensive testing was negative other than increased inflammatory markers. -- No evidence of myeloproliferative neoplasm. -- She now established with rheumatology. -- Labs today show white blood cell count 17.9, hemoglobin 13.3, MCV 91, and platelets of 346. -- No need  for routine follow-up in our clinic unless leukocytosis were to worsen, new hematological abnormalities were to develop, or she would have unexplained symptoms such as fevers or weight loss.  # Family History Factor V Leiden -- No clear indication for testing given that the patient and her mother have never had any VTE's. -- Offered testing to patient for her peace of mind, but she declined. -- If she were to change her mind we would happily test her for factor V Leiden.  No orders of the defined types were placed in this encounter.   All questions were answered. The patient knows to call the clinic with any problems, questions or concerns.  A total of more than  30 minutes were spent on this encounter with face-to-face time and non-face-to-face time, including preparing to see the patient, ordering tests and/or medications, counseling the patient and coordination of care as outlined above.   Ulysees Barns, MD Department of Hematology/Oncology [facility] Phone: [phone] Pager: [phone] Email: [email or web address]  06/21/2023 9:17 AM

## 2023-07-04 ENCOUNTER — Other Ambulatory Visit: Payer: Self-pay | Admitting: Family

## 2023-08-05 ENCOUNTER — Other Ambulatory Visit: Payer: Self-pay | Admitting: Internal Medicine

## 2023-08-06 ENCOUNTER — Other Ambulatory Visit: Payer: Self-pay | Admitting: Family

## 2023-08-06 NOTE — Telephone Encounter (Signed)
Pt is waiting on refills.

## 2023-08-23 NOTE — Telephone Encounter (Signed)
Prescription Request  08/23/2023  LOV: 10/28/2022  What is the name of the medication or equipment?  valsartan (DIOVAN) 160 MG tablet  Pt states she is very worried because she only has one pill left and MD is OOO on Mondays.  Is there another provider that can assist?  Have you contacted your pharmacy to request a refill? Yes   Which pharmacy would you like this sent to?  [id number] [place], [place] - [address] [place] [address] Phone: [phone] Fax: [phone]    Patient notified that their request is being sent to the clinical staff for review and that they should receive a response within 2 business days.   Please advise at Mobile [phone] (mobile)

## 2023-08-23 NOTE — Telephone Encounter (Signed)
Rx sent on 08/06/2023.  Follow up with pharmacy. They states they did not receive it. Will resend and contact pt.

## 2023-09-14 NOTE — Progress Notes (Unsigned)
No chief complaint on file.   HPI: Susan Peters 57 y.o. come in for Chronic disease management  Last visit 11 23   Bp valsartan A1c Hld crstor  Follows  oncology for blood count ROS: See pertinent positives and negatives per HPI.  Past Medical History:  Diagnosis Date   Anemia    Arthritis    Back pain    Hx of bacterial pneumonia    Migraine    long standing 3-4 x per month takes med    Family History  Problem Relation Age of Onset   Arthritis Mother    Cancer Mother        Uterus   Hyperlipidemia Mother    Stroke Mother    Heart disease Mother    Hypertension Mother    Diabetes Mother    Hyperlipidemia Father    Heart disease Father    Hypertension Father    Early death Father    Rheum arthritis Sister    Arthritis Maternal Grandmother    Stroke Maternal Grandmother    Hypertension Maternal Grandmother    Arthritis Maternal Grandfather    Heart disease Maternal Grandfather    Hypertension Maternal Grandfather    Arthritis Paternal Grandmother    Heart disease Paternal Grandmother    Hypertension Paternal Grandmother    Diabetes Paternal Grandmother    Arthritis Paternal Grandfather    Heart disease Paternal Grandfather    Stroke Paternal Grandfather    Hypertension Paternal Grandfather    Kidney disease Paternal Grandfather    Cancer Maternal Aunt    Hypertension Maternal Aunt    Diabetes Maternal Aunt    Arthritis/Rheumatoid Maternal Aunt    Cancer Maternal Aunt        Lung   Diabetes Maternal Aunt    Heart disease Paternal Uncle    Diabetes Cousin     Social History   Socioeconomic History   Marital status: Single    Spouse name: Not on file   Number of children: Not on file   Years of education: Not on file   Highest education level: Bachelor's degree (e.g., BA, AB, BS)  Occupational History   Not on file  Tobacco Use   Smoking status: Every Day    Current packs/day: 0.50    Types: Cigarettes   Smokeless tobacco: Never  Vaping Use    Vaping status: Never Used  Substance and Sexual Activity   Alcohol use: Yes    Alcohol/week: 0.0 standard drinks of alcohol    Comment: social   Drug use: No   Sexual activity: Not on file  Other Topics Concern   Not on file  Social History Narrative   7-8 hours of sleep per night   Lives with her roommate   Works full time as a Education officer, 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 in the home roomates    G1P1  Lives with father and visits    Net fa pos tob etoh    [place]   [employer]  [place]    Social Determinants of Health   Financial Resource Strain: Low Risk  (10/27/2022)   Overall Financial Resource Strain (CARDIA)    Difficulty of Paying Living Expenses: Not very hard  Food Insecurity: No Food Insecurity (10/27/2022)   Hunger Vital Sign    Worried About Running Out of Food in the Last Year: Never true    Ran Out of Food  in the Last Year: Never true  Transportation Needs: No Transportation Needs (10/27/2022)   PRAPARE - Administrator, Civil Service (Medical): No    Lack of Transportation (Non-Medical): No  Physical Activity: Insufficiently Active (10/27/2022)   Exercise Vital Sign    Days of Exercise per Week: 1 day    Minutes of Exercise per Session: 30 min  Stress: No Stress Concern Present (10/27/2022)   [employer] of Occupational Health - Occupational Stress Questionnaire    Feeling of Stress : Only a little  Social Connections: Socially Integrated (10/27/2022)   Social Connection and Isolation Panel [NHANES]    Frequency of Communication with Friends and Family: More than three times a week    Frequency of Social Gatherings with Friends and Family: Once a week    Attends Religious Services: More than 4 times per year    Active Member of [employer] or Organizations: Yes    Attends Engineer, structural: More than 4 times per year    Marital Status: Living with partner    Outpatient Medications  Prior to Visit  Medication Sig Dispense Refill   acetaminophen (TYLENOL) 500 MG tablet Take 500 mg by mouth every 12 (twelve) hours as needed.     diclofenac Sodium (VOLTAREN) 1 % GEL Apply 2 g topically in the morning and at bedtime.     rosuvastatin (CRESTOR) 5 MG tablet TAKE 1 TABLET (5 MG TOTAL) BY MOUTH DAILY. 90 tablet 0   valsartan (DIOVAN) 160 MG tablet TAKE 1 TABLET BY MOUTH EVERY DAY 90 tablet 0   No facility-administered medications prior to visit.     EXAM:  LMP 11/25/2014   There is no height or weight on file to calculate BMI.  GENERAL: vitals reviewed and listed above, alert, oriented, appears well hydrated and in no acute distress HEENT: atraumatic, conjunctiva  clear, no obvious abnormalities on inspection of external nose and ears OP : no lesion edema or exudate  NECK: no obvious masses on inspection palpation  LUNGS: clear to auscultation bilaterally, no wheezes, rales or rhonchi, good air movement CV: HRRR, no clubbing cyanosis or  peripheral edema nl cap refill  MS: moves all extremities without noticeable focal  abnormality PSYCH: pleasant and cooperative, no obvious depression or anxiety Lab Results  Component Value Date   WBC 17.9 (H) 06/21/2023   HGB 13.3 06/21/2023   HCT 41.7 06/21/2023   PLT 346 06/21/2023   GLUCOSE 184 (H) 06/21/2023   CHOL 146 10/28/2022   TRIG 134.0 10/28/2022   HDL 30.90 (L) 10/28/2022   LDLCALC 88 10/28/2022   ALT 13 06/21/2023   AST 11 (L) 06/21/2023   NA 140 06/21/2023   K 3.9 06/21/2023   CL 108 06/21/2023   CREATININE 0.77 06/21/2023   BUN 14 06/21/2023   CO2 26 06/21/2023   TSH 3.64 06/03/2015   HGBA1C 6.2 (A) 10/28/2022   BP Readings from Last 3 Encounters:  06/21/23 (!) 142/70  03/11/23 132/82  02/11/23 130/87    ASSESSMENT AND PLAN:  Discussed the following assessment and plan:  Essential hypertension  Hyperglycemia  Hyperlipidemia, unspecified hyperlipidemia type  -Patient advised to return or  notify health care team  if  new concerns arise.  There are no Patient Instructions on file for this visit.   Neta Mends. Johneric Mcfadden M.D.

## 2023-09-15 ENCOUNTER — Other Ambulatory Visit: Payer: Self-pay | Admitting: Internal Medicine

## 2023-09-15 ENCOUNTER — Ambulatory Visit: Payer: BC Managed Care – PPO | Admitting: Internal Medicine

## 2023-09-15 ENCOUNTER — Encounter: Payer: Self-pay | Admitting: Internal Medicine

## 2023-09-15 VITALS — BP 126/74 | HR 84 | Temp 98.1°F | Ht 63.0 in | Wt 238.4 lb

## 2023-09-15 DIAGNOSIS — I10 Essential (primary) hypertension: Secondary | ICD-10-CM | POA: Diagnosis not present

## 2023-09-15 DIAGNOSIS — E785 Hyperlipidemia, unspecified: Secondary | ICD-10-CM | POA: Diagnosis not present

## 2023-09-15 DIAGNOSIS — D72829 Elevated white blood cell count, unspecified: Secondary | ICD-10-CM

## 2023-09-15 DIAGNOSIS — Z23 Encounter for immunization: Secondary | ICD-10-CM

## 2023-09-15 DIAGNOSIS — Z79899 Other long term (current) drug therapy: Secondary | ICD-10-CM

## 2023-09-15 DIAGNOSIS — R739 Hyperglycemia, unspecified: Secondary | ICD-10-CM

## 2023-09-15 DIAGNOSIS — Z0000 Encounter for general adult medical examination without abnormal findings: Secondary | ICD-10-CM

## 2023-09-15 DIAGNOSIS — F17200 Nicotine dependence, unspecified, uncomplicated: Secondary | ICD-10-CM

## 2023-09-15 DIAGNOSIS — R7982 Elevated C-reactive protein (CRP): Secondary | ICD-10-CM

## 2023-09-15 LAB — POCT GLYCOSYLATED HEMOGLOBIN (HGB A1C): Hemoglobin A1C: 6.5 % — AB (ref 4.0–5.6)

## 2023-09-15 NOTE — Patient Instructions (Addendum)
Good to see you today . BP is controlled A1c is borderline diabetic range  6.5  Due for blood sugar and cholesterol levels.  This fall  Continue work on Tobacco cessation.   Plan fasting labs in  November /December ( I will place future orders )  And then  CPE

## 2023-11-01 ENCOUNTER — Other Ambulatory Visit: Payer: Self-pay | Admitting: Family

## 2023-11-03 ENCOUNTER — Encounter: Payer: Self-pay | Admitting: Internal Medicine

## 2023-11-03 MED ORDER — VALSARTAN 160 MG PO TABS: 160.0000 mg | ORAL_TABLET | Freq: Every day | ORAL | 0 refills | Status: DC

## 2023-11-03 MED ORDER — ROSUVASTATIN CALCIUM 5 MG PO TABS: 5.0000 mg | ORAL_TABLET | Freq: Every day | ORAL | 0 refills | Status: DC

## 2023-11-11 NOTE — Telephone Encounter (Signed)
The prescription was sent on 11/03/2023. Attempted to follow up with pharmacy. Left a voicemail to call us back.

## 2023-11-12 NOTE — Telephone Encounter (Signed)
Attempted to reach pharmacy. Unable to reach them.    Attempted to reach pt to update her that Rx was sent. Left detail message to check with her pharmacy and if still not receive it then let us know then we can resend it.

## 2023-11-23 NOTE — Progress Notes (Incomplete)
ACUTE VISIT No chief complaint on file.  HPI: Susan Peters is a 57 y.o. female with a PMHx significant for HTN, Sciatica, and OA, who is here today complaining of a boil on the side of her breast.   HPI  Review of Systems See other pertinent positives and negatives in HPI.  Current Outpatient Medications on File Prior to Visit  Medication Sig Dispense Refill   acetaminophen (TYLENOL) 500 MG tablet Take 500 mg by mouth every 12 (twelve) hours as needed.     diclofenac Sodium (VOLTAREN) 1 % GEL Apply 2 g topically in the morning and at bedtime.     rosuvastatin (CRESTOR) 5 MG tablet Take 1 tablet (5 mg total) by mouth daily. 90 tablet 0   valsartan (DIOVAN) 160 MG tablet Take 1 tablet (160 mg total) by mouth daily. 90 tablet 0   No current facility-administered medications on file prior to visit.    Past Medical History:  Diagnosis Date   Anemia    Arthritis    Back pain    Hx of bacterial pneumonia    Migraine    long standing 3-4 x per month takes med   No Known Allergies  Social History   Socioeconomic History   Marital status: Single    Spouse name: Not on file   Number of children: Not on file   Years of education: Not on file   Highest education level: Bachelor's degree (e.g., BA, AB, BS)  Occupational History   Not on file  Tobacco Use   Smoking status: Every Day    Current packs/day: 0.50    Types: Cigarettes   Smokeless tobacco: Never  Vaping Use   Vaping status: Never Used  Substance and Sexual Activity   Alcohol use: Yes    Alcohol/week: 0.0 standard drinks of alcohol    Comment: social   Drug use: No   Sexual activity: Not on file  Other Topics Concern   Not on file  Social History Narrative   7-8 hours of sleep per night   Lives with her roommate   Works full time as a Education officer, 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 in the home roomates    G1P1  Lives with father and visits    Net fa pos tob  etoh    [place]   [employer]  [place]    Social Determinants of Health   Financial Resource Strain: Low Risk  (10/27/2022)   Overall Financial Resource Strain (CARDIA)    Difficulty of Paying Living Expenses: Not very hard  Food Insecurity: No Food Insecurity (10/27/2022)   Hunger Vital Sign    Worried About Running Out of Food in the Last Year: Never true    Ran Out of Food in the Last Year: Never true  Transportation Needs: No Transportation Needs (10/27/2022)   PRAPARE - Administrator, Civil Service (Medical): No    Lack of Transportation (Non-Medical): No  Physical Activity: Insufficiently Active (10/27/2022)   Exercise Vital Sign    Days of Exercise per Week: 1 day    Minutes of Exercise per Session: 30 min  Stress: No Stress Concern Present (10/27/2022)   [employer] of Occupational Health - Occupational Stress Questionnaire    Feeling of Stress : Only a little  Social Connections: Socially Integrated (10/27/2022)   Social Connection and Isolation Panel [NHANES]    Frequency of Communication with  Friends and Family: More than three times a week    Frequency of Social Gatherings with Friends and Family: Once a week    Attends Religious Services: More than 4 times per year    Active Member of [employer] or Organizations: Yes    Attends Engineer, structural: More than 4 times per year    Marital Status: Living with partner    There were no vitals filed for this visit. There is no height or weight on file to calculate BMI.  Physical Exam  ASSESSMENT AND PLAN:  Ms. Maaske was seen today for a boil on her breast.   There are no diagnoses linked to this encounter.  No follow-ups on file.  I, Suanne Marker, acting as a scribe for Wentworth Edelen [place], MD., have documented all relevant documentation on the behalf of Susan Germond [place], MD, as directed by  Susan Frappier [place], MD while in the presence of Susan Sigman [place], MD.   I, Suanne Marker, have reviewed all  documentation for this visit. The documentation on 11/23/23 for the exam, diagnosis, procedures, and orders are all accurate and complete.  Susan Burnham G. [place], MD  [facility]. [place] office.  Discharge Instructions   None

## 2023-11-24 ENCOUNTER — Ambulatory Visit: Payer: BC Managed Care – PPO | Admitting: Family Medicine

## 2023-11-24 ENCOUNTER — Encounter: Payer: Self-pay | Admitting: Family Medicine

## 2023-11-24 VITALS — BP 120/80 | HR 88 | Temp 98.3°F | Resp 16 | Ht 63.0 in | Wt 235.4 lb

## 2023-11-24 DIAGNOSIS — J869 Pyothorax without fistula: Secondary | ICD-10-CM | POA: Diagnosis not present

## 2023-11-24 MED ORDER — SULFAMETHOXAZOLE-TRIMETHOPRIM 800-160 MG PO TABS: 2.0000 | ORAL_TABLET | Freq: Two times a day (BID) | ORAL | 0 refills | Status: DC

## 2023-11-24 MED ORDER — CEFTRIAXONE SODIUM 500 MG IJ SOLR
500.0000 mg | Freq: Once | INTRAMUSCULAR | Status: AC
  Administered 2023-11-24: 500 mg via INTRAMUSCULAR

## 2023-11-24 NOTE — Patient Instructions (Addendum)
A few things to remember from today's visit:  Abscess, thorax (HCC) - Plan: sulfamethoxazole-trimethoprim (BACTRIM DS) 800-160 MG tablet  Here in the office you received Rocephin 500 mg IM. Start oral antibiotic today. Continue local heat. Monitor for fever or other systemic symptoms. Follow in 1 week.  Do not use My Chart to request refills or for acute issues that need immediate attention. If you send a my chart message, it may take a few days to be addressed, specially if I am not in the office.  Please be sure medication list is accurate. If a new problem present, please set up appointment sooner than planned today.

## 2023-11-30 NOTE — Progress Notes (Signed)
ACUTE VISIT No chief complaint on file.  HPI: [name] is a 57 y.o. female with a PMHx significant for HTN, tobacco use disorder,and OA, who is here today to follow up on a visit last week for a thorax abscess.   Review of Systems See other pertinent positives and negatives in HPI.  Current Outpatient Medications on File Prior to Visit  Medication Sig Dispense Refill   acetaminophen (TYLENOL) 500 MG tablet Take 500 mg by mouth every 12 (twelve) hours as needed.     diclofenac Sodium (VOLTAREN) 1 % GEL Apply 2 g topically in the morning and at bedtime.     rosuvastatin (CRESTOR) 5 MG tablet Take 1 tablet (5 mg total) by mouth daily. 90 tablet 0   sulfamethoxazole-trimethoprim (BACTRIM DS) 800-160 MG tablet Take 2 tablets by mouth 2 (two) times daily for 7 days. 28 tablet 0   valsartan (DIOVAN) 160 MG tablet Take 1 tablet (160 mg total) by mouth daily. 90 tablet 0   No current facility-administered medications on file prior to visit.    Past Medical History:  Diagnosis Date   Anemia    Arthritis    Back pain    Hx of bacterial pneumonia    Migraine    long standing 3-4 x per month takes med   No Known Allergies  Social History   Socioeconomic History   Marital status: Single    Spouse name: Not on file   Number of children: Not on file   Years of education: Not on file   Highest education level: Bachelor's degree (e.g., BA, AB, BS)  Occupational History   Not on file  Tobacco Use   Smoking status: Every Day    Current packs/day: 0.50    Types: Cigarettes   Smokeless tobacco: Never  Vaping Use   Vaping status: Never Used  Substance and Sexual Activity   Alcohol use: Yes    Alcohol/week: 0.0 standard drinks of alcohol    Comment: social   Drug use: No   Sexual activity: Not on file  Other Topics Concern   Not on file  Social History Narrative   7-8 hours of sleep per night   Lives with her roommate   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   2 Cats in the home roomates    G1P1  Lives with father and visits    Net fa pos tob etoh    [place]   [employer]  [place]    Social Determinants of Health   Financial Resource Strain: Low Risk  (10/27/2022)   Overall Financial Resource Strain (CARDIA)    Difficulty of Paying Living Expenses: Not very hard  Food Insecurity: No Food Insecurity (10/27/2022)   Hunger Vital Sign    Worried About Running Out of Food in the Last Year: Never true    Ran Out of Food in the Last Year: Never true  Transportation Needs: No Transportation Needs (10/27/2022)   PRAPARE - Administrator, Civil Service (Medical): No    Lack of Transportation (Non-Medical): No  Physical Activity: Insufficiently Active (10/27/2022)   Exercise Vital Sign    Days of Exercise per Week: 1 day    Minutes of Exercise per Session: 30 min  Stress: No Stress Concern Present (10/27/2022)   [employer] of Occupational Health - Occupational Stress Questionnaire    Feeling of Stress :  Only a little  Social Connections: Socially Integrated (10/27/2022)   Social Connection and Isolation Panel [NHANES]    Frequency of Communication with Friends and Family: More than three times a week    Frequency of Social Gatherings with Friends and Family: Once a week    Attends Religious Services: More than 4 times per year    Active Member of [employer] or Organizations: Yes    Attends Engineer, structural: More than 4 times per year    Marital Status: Living with partner    There were no vitals filed for this visit. There is no height or weight on file to calculate BMI.  Physical Exam  ASSESSMENT AND PLAN:  Ms. Susan Peters was seen today for a thorax abscess. ***  There are no diagnoses linked to this encounter.  No follow-ups on file.  I, Susan Peters, acting as a scribe for Susan Honor [place], MD., have documented all relevant documentation on the behalf of  Susan Sciara [place], MD, as directed by  Susan Macfadden [place], MD while in the presence of Susan Riolo [place], MD.   I, Susan Peters, have reviewed all documentation for this visit. The documentation on 11/30/23 for the exam, diagnosis, procedures, and orders are all accurate and complete.  Susan Simoneaux G. [place], MD  [facility]. [place] office.  Discharge Instructions   None

## 2023-12-01 ENCOUNTER — Encounter: Payer: Self-pay | Admitting: Family Medicine

## 2023-12-01 ENCOUNTER — Ambulatory Visit: Payer: BC Managed Care – PPO | Admitting: Family Medicine

## 2023-12-01 DIAGNOSIS — J869 Pyothorax without fistula: Secondary | ICD-10-CM | POA: Diagnosis not present

## 2023-12-01 MED ORDER — SULFAMETHOXAZOLE-TRIMETHOPRIM 800-160 MG PO TABS: 2.0000 | ORAL_TABLET | Freq: Two times a day (BID) | ORAL | 0 refills | Status: AC

## 2023-12-01 MED ORDER — MUPIROCIN 2 % EX OINT: 1.0000 | TOPICAL_OINTMENT | Freq: Two times a day (BID) | CUTANEOUS | 0 refills | Status: DC

## 2023-12-01 NOTE — Patient Instructions (Addendum)
A few things to remember from today's visit:  Abscess, thorax (HCC) - Plan: sulfamethoxazole-trimethoprim (BACTRIM DS) 800-160 MG tablet, mupirocin ointment (BACTROBAN) 2 %  Complete 10 days of treatment with bactrim, start applying antibiotic ointment on area 2 times daily. Keep it clean with soap and water, gentile pressure to help with draining a couple times per day. Monitor for fever.  Do not use My Chart to request refills or for acute issues that need immediate attention. If you send a my chart message, it may take a few days to be addressed, specially if I am not in the office.  Please be sure medication list is accurate. If a new problem present, please set up appointment sooner than planned today.

## 2023-12-06 ENCOUNTER — Other Ambulatory Visit: Payer: BC Managed Care – PPO

## 2024-02-04 ENCOUNTER — Other Ambulatory Visit: Payer: Self-pay | Admitting: Internal Medicine

## 2024-02-06 ENCOUNTER — Other Ambulatory Visit: Payer: Self-pay | Admitting: Internal Medicine

## 2024-02-24 NOTE — Progress Notes (Deleted)
 Office Visit Note  Patient: Susan Peters             Date of Birth: February 19, 1966           MRN: [id number]             PCP: Madelin Headings, MD Referring: Madelin Headings, MD Visit Date: 03/09/2024   Subjective:  No chief complaint on file.   History of Present Illness: Susan Peters is a 58 y.o. female here for follow up for joint pain and swelling with elevated sedum markers of inflammation.    Previous HPI 03/11/2023 Susan Peters is a 57 y.o. female here for follow up for joint pain and swelling with elevated sedum markers of inflammation. Since last visit her symptoms are stable if anything slightly better, currently using topical voltaren about once daily and takes tylenol or ibuprofen about once weekly. Right side of thigh remains numb chronically no new worsening of sciatica.    Previous HPI 12/12/23 Susan Peters is a 58 y.o. female here for evaluation of chronic hand swelling and prolonged morning joint stiffness and knee pain.  She has very longstanding bilateral hip osteoarthritis known since decades ago.  Does not know if these areas of really had any worsening since she is used to a considerable amount of pain with activities on a daily basis.  That hand swelling started since around November of last year.  Does not recall any preceding illness injury or medication change.  She noticed swelling pretty diffusely throughout the fingers of both hands.  Especially worse overnight and first thing in the mornings.  Topical Voltaren has been very helpful and she still using this which decreases the swelling almost completely.  Symptoms have gradually declined since the initial onset November but still gets right hand overnight swelling and pain if she does not sleep with this elevated.  Denies any significant numbness tingling or loss of grip strength.  Does not see any rashes or discoloration.  No increase in swelling elsewhere.     Labs reviewed 10/2022 ANA neg RF neg CCP neg ESR  63 CRP 3.8 WBC 14.7   No Rheumatology ROS completed.   PMFS History:  Patient Active Problem List   Diagnosis Date Noted   Sedimentation rate elevation 02/11/2023   Bilateral hand pain 02/11/2023   Bilateral primary osteoarthritis of hip 02/11/2023   Abnormal laboratory test result 02/11/2023   Essential hypertension 05/13/2022   Elevated alkaline phosphatase level 06/10/2015   Sciatica 11/27/2014   Tobacco use disorder 11/27/2014   Frequent headaches 11/27/2014   Family history of diabetes mellitus (DM) 11/27/2014   Family history of heart disease 11/27/2014    Past Medical History:  Diagnosis Date   Anemia    Arthritis    Back pain    Hx of bacterial pneumonia    Migraine    long standing 3-4 x per month takes med    Family History  Problem Relation Age of Onset   Arthritis Mother    Cancer Mother        Uterus   Hyperlipidemia Mother    Stroke Mother    Heart disease Mother    Hypertension Mother    Diabetes Mother    Hyperlipidemia Father    Heart disease Father    Hypertension Father    Early death Father    Rheum arthritis Sister    Arthritis Maternal Grandmother    Stroke Maternal Grandmother  Hypertension Maternal Grandmother    Arthritis Maternal Grandfather    Heart disease Maternal Grandfather    Hypertension Maternal Grandfather    Arthritis Paternal Grandmother    Heart disease Paternal Grandmother    Hypertension Paternal Grandmother    Diabetes Paternal Grandmother    Arthritis Paternal Grandfather    Heart disease Paternal Grandfather    Stroke Paternal Grandfather    Hypertension Paternal Grandfather    Kidney disease Paternal Grandfather    Cancer Maternal Aunt    Hypertension Maternal Aunt    Diabetes Maternal Aunt    Arthritis/Rheumatoid Maternal Aunt    Cancer Maternal Aunt        Lung   Diabetes Maternal Aunt    Heart disease Paternal Uncle    Diabetes Cousin    Past Surgical History:  Procedure Laterality Date    ABDOMINAL HYSTERECTOMY  01/2015   KNEE SURGERY Left    1982   KNEE SURGERY Left 2019   KNEE SURGERY Right 2021   SHOULDER SURGERY Left    1981   Social History   Social History Narrative   7-8 hours of sleep per night   Lives with her roommate   Works full time as a Education officer, 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 in the home roomates    G1P1  Lives with father and visits    Net fa pos tob etoh    [place]   [employer]  [place]    Immunization History  Administered Date(s) Administered   Influenza, High Dose Seasonal PF 11/27/2014, 10/15/2015   Influenza, Seasonal, Injecte, Preservative Fre 09/15/2023   Influenza,inj,Quad PF,6+ Mos 11/27/2014, 10/15/2015, 11/15/2019, 10/28/2022   Influenza,inj,quad, With Preservative 09/27/2018   Tdap 11/27/2014     Objective: Vital Signs: LMP 11/25/2014    Physical Exam   Musculoskeletal Exam: ***  CDAI Exam: CDAI Score: -- Patient Global: --; Provider Global: -- Swollen: --; Tender: -- Joint Exam 03/09/2024   No joint exam has been documented for this visit   There is currently no information documented on the homunculus. Go to the Rheumatology activity and complete the homunculus joint exam.  Investigation: No additional findings.  Imaging: No results found.  Recent Labs: Lab Results  Component Value Date   WBC 17.9 (H) 06/21/2023   HGB 13.3 06/21/2023   PLT 346 06/21/2023   NA 140 06/21/2023   K 3.9 06/21/2023   CL 108 06/21/2023   CO2 26 06/21/2023   GLUCOSE 184 (H) 06/21/2023   BUN 14 06/21/2023   CREATININE 0.77 06/21/2023   BILITOT 0.2 (L) 06/21/2023   ALKPHOS 120 06/21/2023   AST 11 (L) 06/21/2023   ALT 13 06/21/2023   PROT 6.6 06/21/2023   ALBUMIN 3.8 06/21/2023   CALCIUM 9.1 06/21/2023   GFRAA >60 07/05/2019    Speciality Comments: No specialty comments available.  Procedures:  No procedures performed Allergies: Patient has no known allergies.    Assessment / Plan:     Visit Diagnoses: No diagnosis found.  ***  Orders: No orders of the defined types were placed in this encounter.  No orders of the defined types were placed in this encounter.    Follow-Up Instructions: No follow-ups on file.   Metta Clines, RT  Note - This record has been created using [employer].  Chart creation errors have been sought, but may not always  have been located. Such creation errors do not  reflect on  the standard of medical care.

## 2024-03-09 ENCOUNTER — Ambulatory Visit: Payer: BC Managed Care – PPO | Admitting: Internal Medicine

## 2024-03-09 DIAGNOSIS — M160 Bilateral primary osteoarthritis of hip: Secondary | ICD-10-CM

## 2024-03-09 DIAGNOSIS — R700 Elevated erythrocyte sedimentation rate: Secondary | ICD-10-CM

## 2024-03-09 DIAGNOSIS — M79641 Pain in right hand: Secondary | ICD-10-CM

## 2024-03-25 ENCOUNTER — Encounter: Payer: Self-pay | Admitting: Internal Medicine

## 2024-05-03 ENCOUNTER — Other Ambulatory Visit: Payer: Self-pay | Admitting: Family

## 2024-05-04 ENCOUNTER — Other Ambulatory Visit: Payer: Self-pay | Admitting: Internal Medicine

## 2024-05-04 MED ORDER — ROSUVASTATIN CALCIUM 5 MG PO TABS: 5.0000 mg | ORAL_TABLET | Freq: Every day | ORAL | 0 refills | Status: DC

## 2024-05-04 NOTE — Telephone Encounter (Signed)
 Copied from CRM [id number]. Topic: Clinical - Medication Refill >> May 04, 2024  2:51 PM Dyann Glaser G wrote: Medication: rosuvastatin  (CRESTOR ) 5 MG tablet  Has the patient contacted their pharmacy? Yes (Agent: If no, request that the patient contact the pharmacy for the refill. If patient does not wish to contact the pharmacy document the reason why and proceed with request.) (Agent: If yes, when and what did the pharmacy advise?)  This is the patient's preferred pharmacy:  [id number] [place], [place] - [address] [place] [address] Phone: [phone] Fax: [phone]  Is this the correct pharmacy for this prescription? Yes If no, delete pharmacy and type the correct one.   Has the prescription been filled recently? Yes  Is the patient out of the medication? No  Has the patient been seen for an appointment in the last year OR does the patient have an upcoming appointment? Yes  Can we respond through MyChart? Yes  Agent: Please be advised that Rx refills may take up to 3 business days. We ask that you follow-up with your pharmacy.

## 2024-05-04 NOTE — Telephone Encounter (Signed)
 Per note from 09/15/2023:  Plan fasting labs in  November Susan Peters ( I will place future orders )  And then  CPE   Attempted to reach pt to scheduled an appt. Left a voicemail to call us  back.

## 2024-05-17 NOTE — Progress Notes (Signed)
 Chief Complaint  Patient presents with   Annual Exam    Pt reports she is fasting and discussion on pneumonia vaccine.     HPI: Patient  Susan Peters  58 y.o. comes in today for Preventive Health Care visit  And med check  Last pcp visit  9 24  rx thorax abscess  12 24 .  CDM dx:  HT:  on valsartan  no se noted  Tobacco : 1 ppd  trying to  reduce. Roommate smokes HLD no meds No c/o dental stable  Active job  many steps  per day denies pulm cv sx     Health Maintenance  Topic Date Due   Pneumococcal Vaccine 36-14 Years old (1 of 2 - PCV) Never done   COVID-19 Vaccine (4 - 2024-25 season) 06/03/2024 (Originally 08/29/2023)   Zoster Vaccines- Shingrix (1 of 2) 08/18/2024 (Originally 12/12/1985)   Cervical Cancer Screening (HPV/Pap Cotest)  02/18/2025 (Originally 12/12/1996)   MAMMOGRAM  02/18/2025 (Originally 12/12/2016)   Hepatitis C Screening  05/18/2025 (Originally 12/12/1984)   HIV Screening  05/18/2025 (Originally 12/12/1981)   INFLUENZA VACCINE  07/28/2024   DTaP/Tdap/Td (2 - Td or Tdap) 11/27/2024   Colonoscopy  09/10/2027   HPV VACCINES  Aged Out   Meningococcal B Vaccine  Aged Out   Health Maintenance Review LIFESTYLE:  Exercise:   work    Tobacco/ETS:  Alcohol:  n Sugar beverages: pepsi 40 oz sugar  aware  only water at [employer]  Sleep: 8  Drug use: no HH of 2 + roommates mom  moved in age 75 Work:  60 hours    ROS:  GEN/ HEENT: No fever, significant weight changes sweats headaches vision problems hearing changes, CV/ PULM; No chest pain shortness of breath cough, syncope,slight edema on feet  change in exercise tolerance. GI /GU: No adominal pain, vomiting, change in bowel habits. No blood in the stool. No significant GU symptoms. SKIN/HEME: ,no acute skin rashes suspicious lesions or bleeding. No lymphadenopathy, nodules, masses.  NEURO/ PSYCH:  No neurologic signs such as weakness numbness. No depression anxiety. IMM/ Allergy: No unusual infections.  Allergy  .   REST of 12 system review negative except as per HPI   Past Medical History:  Diagnosis Date   Anemia    Arthritis    Back pain    Hx of bacterial pneumonia    Migraine    long standing 3-4 x per month takes med    Past Surgical History:  Procedure Laterality Date   ABDOMINAL HYSTERECTOMY  01/2015   KNEE SURGERY Left    1982   KNEE SURGERY Left 2019   KNEE SURGERY Right 2021   SHOULDER SURGERY Left    1981    Family History  Problem Relation Age of Onset   Arthritis Mother    Cancer Mother        Uterus   Hyperlipidemia Mother    Stroke Mother    Heart disease Mother    Hypertension Mother    Diabetes Mother    Hyperlipidemia Father    Heart disease Father    Hypertension Father    Early death Father    Rheum arthritis Sister    Arthritis Maternal Grandmother    Stroke Maternal Grandmother    Hypertension Maternal Grandmother    Arthritis Maternal Grandfather    Heart disease Maternal Grandfather    Hypertension Maternal Grandfather    Arthritis Paternal Grandmother    Heart disease Paternal Grandmother  Hypertension Paternal Grandmother    Diabetes Paternal Grandmother    Arthritis Paternal Grandfather    Heart disease Paternal Grandfather    Stroke Paternal Grandfather    Hypertension Paternal Grandfather    Kidney disease Paternal Grandfather    Cancer Maternal Aunt    Hypertension Maternal Aunt    Diabetes Maternal Aunt    Arthritis/Rheumatoid Maternal Aunt    Cancer Maternal Aunt        Lung   Diabetes Maternal Aunt    Heart disease Paternal Uncle    Diabetes Cousin     Social History   Socioeconomic History   Marital status: Single    Spouse name: Not on file   Number of children: Not on file   Years of education: Not on file   Highest education level: Bachelor's degree (e.g., BA, AB, BS)  Occupational History   Not on file  Tobacco Use   Smoking status: Every Day    Current packs/day: 0.50    Types: Cigarettes   Smokeless  tobacco: Never  Vaping Use   Vaping status: Never Used  Substance and Sexual Activity   Alcohol use: Yes    Alcohol/week: 0.0 standard drinks of alcohol    Comment: social   Drug use: No   Sexual activity: Not on file  Other Topics Concern   Not on file  Social History Narrative   7-8 hours of sleep per night   Lives with her roommate   Works full time as a Education officer, 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 in the home roomates    G1P1  Lives with father and visits    Net fa pos tob etoh    [place]   [employer]  [place]    Social Drivers of Health   Financial Resource Strain: Low Risk  (10/27/2022)   Overall Financial Resource Strain (CARDIA)    Difficulty of Paying Living Expenses: Not very hard  Food Insecurity: No Food Insecurity (10/27/2022)   Hunger Vital Sign    Worried About Running Out of Food in the Last Year: Never true    Ran Out of Food in the Last Year: Never true  Transportation Needs: No Transportation Needs (10/27/2022)   PRAPARE - Administrator, Civil Service (Medical): No    Lack of Transportation (Non-Medical): No  Physical Activity: Insufficiently Active (10/27/2022)   Exercise Vital Sign    Days of Exercise per Week: 1 day    Minutes of Exercise per Session: 30 min  Stress: No Stress Concern Present (10/27/2022)   [employer] of Occupational Health - Occupational Stress Questionnaire    Feeling of Stress : Only a little  Social Connections: Socially Integrated (10/27/2022)   Social Connection and Isolation Panel [NHANES]    Frequency of Communication with Friends and Family: More than three times a week    Frequency of Social Gatherings with Friends and Family: Once a week    Attends Religious Services: More than 4 times per year    Active Member of [employer] or Organizations: Yes    Attends Engineer, structural: More than 4 times per year    Marital Status: Living with partner     Outpatient Medications Prior to Visit  Medication Sig Dispense Refill   acetaminophen  (TYLENOL ) 500 MG tablet Take 500 mg by mouth every 12 (twelve) hours as needed.     diclofenac Sodium (VOLTAREN) 1 %  GEL Apply 2 g topically in the morning and at bedtime.     rosuvastatin  (CRESTOR ) 5 MG tablet Take 1 tablet (5 mg total) by mouth daily. 90 tablet 0   valsartan  (DIOVAN ) 160 MG tablet TAKE 1 TABLET BY MOUTH EVERY DAY 30 tablet 0   mupirocin  ointment (BACTROBAN ) 2 % Apply 1 Application topically 2 (two) times daily. (Patient not taking: Reported on 05/18/2024) 30 g 0   No facility-administered medications prior to visit.     EXAM:  BP 130/78 (BP Location: Left Arm, Patient Position: Sitting, Cuff Size: Large)   Pulse 79   Temp 98.1 F (36.7 C) (Oral)   Ht 5' 2.8" (1.595 m)   Wt 243 lb 12.8 oz (110.6 kg)   LMP 11/25/2014   SpO2 97%   BMI 43.46 kg/m   Body mass index is 43.46 kg/m. Wt Readings from Last 3 Encounters:  05/18/24 243 lb 12.8 oz (110.6 kg)  12/01/23 238 lb 4 oz (108.1 kg)  11/24/23 235 lb 6 oz (106.8 kg)    Physical Exam: Vital signs reviewed WGN:FAOZ is a well-developed well-nourished alert cooperative    who appearsr stated age in no acute distress.  HEENT: normocephalic atraumatic , Eyes: PERRL EOM's full, conjunctiva clear, Nares: paten,t no deformity discharge or tenderness., Ears: no deformity EAC's clear TMs with normal landmarks. Mouth: clear OP, no lesions, edema.  Moist mucous membranes. Dentition  NECK: supple without masses, thyromegaly or bruits. CHEST/PULM:  Clear to auscultation and percussion breath sounds equal no wheeze , rales or rhonchi. No chest wall deformities or tenderness. Breast: normal by inspection . No dimpling, discharge, masses, tenderness or discharge . CV: PMI is nondisplaced, S1 S2 no gallops, murmurs, rubs. Peripheral pulses are full without delay.No JVD .  ABDOMEN: Bowel sounds normal nontender  No guard or rebound, no hepato  splenomegal no CVA tenderness.  Extremtities:  No clubbing cyanosis or edema, no acute joint swelling or redness no focal atrophy NEURO:  Oriented x3, cranial nerves 3-12 appear to be intact, no obvious focal weakness,gait within normal limits no abnormal reflexes or asymmetrical SKIN: No acute rashes normal turgor, color, no bruising or petechiae. Sks and skin tags  PSYCH: Oriented, good eye contact, no obvious depression anxiety, cognition and judgment appear normal. LN: no cervical axillary iadenopathy    Lab Results  Component Value Date   WBC 17.9 (H) 06/21/2023   HGB 13.3 06/21/2023   HCT 41.7 06/21/2023   PLT 346 06/21/2023   GLUCOSE 184 (H) 06/21/2023   CHOL 146 10/28/2022   TRIG 134.0 10/28/2022   HDL 30.90 (L) 10/28/2022   LDLCALC 88 10/28/2022   ALT 13 06/21/2023   AST 11 (L) 06/21/2023   NA 140 06/21/2023   K 3.9 06/21/2023   CL 108 06/21/2023   CREATININE 0.77 06/21/2023   BUN 14 06/21/2023   CO2 26 06/21/2023   TSH 3.64 06/03/2015   HGBA1C 6.5 (A) 09/15/2023    BP Readings from Last 3 Encounters:  05/18/24 130/78  12/01/23 120/70  11/24/23 120/80   Last night  fasting  Lab plan reviewed with patient   ASSESSMENT AND PLAN:  Discussed the following assessment and plan:    ICD-10-CM   1. Visit for preventive health examination  Z00.00 CBC with Differential/Platelet    Lipid panel    TSH    Hemoglobin A1c    Lipoprotein A (LPA)    Microalbumin / creatinine urine ratio    Varicella zoster antibody, IgG  Vitamin D, 25-hydroxy    Gamma GT    Basic Metabolic Panel    2. Medication management  Z79.899 CBC with Differential/Platelet    Lipid panel    TSH    Hemoglobin A1c    Lipoprotein A (LPA)    Microalbumin / creatinine urine ratio    Vitamin D, 25-hydroxy    Gamma GT    Basic Metabolic Panel    3. Tobacco use disorder  F17.200 CBC with Differential/Platelet    Lipid panel    TSH    Hemoglobin A1c    Lipoprotein A (LPA)    Microalbumin /  creatinine urine ratio    Vitamin D, 25-hydroxy    Gamma GT    Basic Metabolic Panel    4. Essential hypertension  I10 CBC with Differential/Platelet    Lipid panel    TSH    Hemoglobin A1c    Lipoprotein A (LPA)    Microalbumin / creatinine urine ratio    Vitamin D, 25-hydroxy    Gamma GT    Basic Metabolic Panel    5. Elevated alkaline phosphatase level  R74.8 CBC with Differential/Platelet    Lipid panel    TSH    Hemoglobin A1c    Lipoprotein A (LPA)    Microalbumin / creatinine urine ratio    Vitamin D, 25-hydroxy    Gamma GT    Basic Metabolic Panel    6. Prediabetes  R73.03 CBC with Differential/Platelet    Lipid panel    TSH    Hemoglobin A1c    Lipoprotein A (LPA)    Microalbumin / creatinine urine ratio    Vitamin D, 25-hydroxy    Gamma GT    Basic Metabolic Panel    7. Elevated hemoglobin A1c  R73.09 CBC with Differential/Platelet    Lipid panel    TSH    Hemoglobin A1c    Lipoprotein A (LPA)    Microalbumin / creatinine urine ratio    Vitamin D, 25-hydroxy    Gamma GT    Basic Metabolic Panel    8. Immunity status testing  Z01.84 Varicella zoster antibody, IgG    Vitamin D, 25-hydroxy    Gamma GT    9. Vitamin D deficiency  E55.9 Vitamin D, 25-hydroxy    Gamma GT    Basic Metabolic Panel    10. Hyperlipidemia, unspecified hyperlipidemia type  E78.5 CBC with Differential/Platelet    Lipid panel    TSH    Hemoglobin A1c    Lipoprotein A (LPA)    Microalbumin / creatinine urine ratio    Vitamin D, 25-hydroxy    Gamma GT    Basic Metabolic Panel    Hepatic Function Panel     Counsel about   decreasing sugar beverages tobacco cessation . Update labs and plan fu  may add medications for metabolic control help. Plan rov in about 4 months depending on  results  Disc pna vaccine  will delay Shingrix says she never got varicella but family did  doubt she is not immune will check serology today . Revewie or record  update alk phos and  vit d    Return in about 4 months (around 09/18/2024) for depending on results.  Patient Care Team: Zebulin Siegel, Joaquim Muir, MD as PCP - General (Internal Medicine) Thora Flint, MD as Consulting Physician (Obstetrics and Gynecology) Patient Instructions  Good to see you today  Refilling  valsartan  Stop tobacco as planned . Avoid sugar beverages as discussed .  Lab today   sugar in past close to diabetic range   Intensify lifestyle interventions.  Are important in control of blood sugar.   Allia Wiltsey K. Mahrukh Seguin M.D.

## 2024-05-18 ENCOUNTER — Encounter: Payer: Self-pay | Admitting: Internal Medicine

## 2024-05-18 ENCOUNTER — Ambulatory Visit (INDEPENDENT_AMBULATORY_CARE_PROVIDER_SITE_OTHER): Admitting: Internal Medicine

## 2024-05-18 VITALS — BP 130/78 | HR 79 | Temp 98.1°F | Ht 62.8 in | Wt 243.8 lb

## 2024-05-18 DIAGNOSIS — R7309 Other abnormal glucose: Secondary | ICD-10-CM

## 2024-05-18 DIAGNOSIS — R748 Abnormal levels of other serum enzymes: Secondary | ICD-10-CM | POA: Diagnosis not present

## 2024-05-18 DIAGNOSIS — I10 Essential (primary) hypertension: Secondary | ICD-10-CM

## 2024-05-18 DIAGNOSIS — Z0000 Encounter for general adult medical examination without abnormal findings: Secondary | ICD-10-CM

## 2024-05-18 DIAGNOSIS — Z79899 Other long term (current) drug therapy: Secondary | ICD-10-CM

## 2024-05-18 DIAGNOSIS — F17200 Nicotine dependence, unspecified, uncomplicated: Secondary | ICD-10-CM

## 2024-05-18 DIAGNOSIS — Z0184 Encounter for antibody response examination: Secondary | ICD-10-CM | POA: Diagnosis not present

## 2024-05-18 DIAGNOSIS — E559 Vitamin D deficiency, unspecified: Secondary | ICD-10-CM | POA: Diagnosis not present

## 2024-05-18 DIAGNOSIS — R7303 Prediabetes: Secondary | ICD-10-CM | POA: Diagnosis not present

## 2024-05-18 DIAGNOSIS — E785 Hyperlipidemia, unspecified: Secondary | ICD-10-CM

## 2024-05-18 LAB — CBC WITH DIFFERENTIAL/PLATELET
Basophils Absolute: 0.1 10*3/uL (ref 0.0–0.1)
Basophils Relative: 0.7 % (ref 0.0–3.0)
Eosinophils Absolute: 0.2 10*3/uL (ref 0.0–0.7)
Eosinophils Relative: 1.6 % (ref 0.0–5.0)
HCT: 39.7 % (ref 36.0–46.0)
Hemoglobin: 12.9 g/dL (ref 12.0–15.0)
Lymphocytes Relative: 29.1 % (ref 12.0–46.0)
Lymphs Abs: 4.3 10*3/uL — ABNORMAL HIGH (ref 0.7–4.0)
MCHC: 32.5 g/dL (ref 30.0–36.0)
MCV: 86.5 fl (ref 78.0–100.0)
Monocytes Absolute: 0.8 10*3/uL (ref 0.1–1.0)
Monocytes Relative: 5.2 % (ref 3.0–12.0)
Neutro Abs: 9.4 10*3/uL — ABNORMAL HIGH (ref 1.4–7.7)
Neutrophils Relative %: 63.4 % (ref 43.0–77.0)
Platelets: 282 10*3/uL (ref 150.0–400.0)
RBC: 4.6 Mil/uL (ref 3.87–5.11)
RDW: 14.1 % (ref 11.5–15.5)
WBC: 14.9 10*3/uL — ABNORMAL HIGH (ref 4.0–10.5)

## 2024-05-18 LAB — BASIC METABOLIC PANEL WITH GFR
BUN: 14 mg/dL (ref 6–23)
CO2: 27 meq/L (ref 19–32)
Calcium: 9 mg/dL (ref 8.4–10.5)
Chloride: 106 meq/L (ref 96–112)
Creatinine, Ser: 0.69 mg/dL (ref 0.40–1.20)
GFR: 96.33 mL/min (ref >60.00)
Glucose, Bld: 89 mg/dL (ref 70–99)
Potassium: 3.9 meq/L (ref 3.5–5.1)
Sodium: 140 meq/L (ref 135–145)

## 2024-05-18 LAB — LIPID PANEL
Cholesterol: 154 mg/dL (ref 0–200)
HDL: 35.7 mg/dL — ABNORMAL LOW (ref >39.00)
LDL Cholesterol: 89 mg/dL (ref 0–99)
NonHDL: 117.99
Total CHOL/HDL Ratio: 4
Triglycerides: 143 mg/dL (ref 0.0–149.0)
VLDL: 28.6 mg/dL (ref 0.0–40.0)

## 2024-05-18 LAB — HEPATIC FUNCTION PANEL
ALT: 16 U/L (ref 0–35)
AST: 15 U/L (ref 0–37)
Albumin: 4.2 g/dL (ref 3.5–5.2)
Alkaline Phosphatase: 132 U/L — ABNORMAL HIGH (ref 39–117)
Bilirubin, Direct: 0 mg/dL (ref 0.0–0.3)
Total Bilirubin: 0.3 mg/dL (ref 0.2–1.2)
Total Protein: 7.2 g/dL (ref 6.0–8.3)

## 2024-05-18 LAB — MICROALBUMIN / CREATININE URINE RATIO
Creatinine,U: 80.2 mg/dL
Microalb Creat Ratio: UNDETERMINED mg/g (ref 0.0–30.0)
Microalb, Ur: <0.7 mg/dL

## 2024-05-18 LAB — HEMOGLOBIN A1C: Hgb A1c MFr Bld: 6.9 % — ABNORMAL HIGH (ref 4.6–6.5)

## 2024-05-18 LAB — VITAMIN D 25 HYDROXY (VIT D DEFICIENCY, FRACTURES): VITD: 10.75 ng/mL — ABNORMAL LOW (ref 30.00–100.00)

## 2024-05-18 LAB — GAMMA GT: GGT: 25 U/L (ref 7–51)

## 2024-05-18 LAB — TSH: TSH: 3.65 u[IU]/mL (ref 0.35–5.50)

## 2024-05-18 MED ORDER — VALSARTAN 160 MG PO TABS: 160.0000 mg | ORAL_TABLET | Freq: Every day | ORAL | 3 refills | Status: AC

## 2024-05-18 NOTE — Patient Instructions (Addendum)
 Good to see you today  Refilling  valsartan  Stop tobacco as planned . Avoid sugar beverages as discussed .  Lab today   sugar in past close to diabetic range   Intensify lifestyle interventions.  Are important in control of blood sugar.

## 2024-05-21 LAB — VARICELLA ZOSTER ANTIBODY, IGG: Varicella IgG: 5.99 {s_co_ratio}

## 2024-05-21 LAB — LIPOPROTEIN A (LPA): Lipoprotein (a): 353 nmol/L — ABNORMAL HIGH (ref <75)

## 2024-05-22 ENCOUNTER — Ambulatory Visit: Payer: Self-pay | Admitting: Internal Medicine

## 2024-05-22 NOTE — Progress Notes (Signed)
 So blood sugar is now in diabetic range . Lipo a is high and not favorable so  want to make sure cholesterol levels are controled . Vit d is low also You are immune  to chicken pox  so you'd be a candidate to shingrix  when convenient . I advise add vit d3  1000- 2000   I u per day   Make fu appt  virtual ok to review results and  intervention options  and plan fu  Continue  intensify lifestyle intervention healthy eating and exercise . A planned at the visit

## 2024-06-21 ENCOUNTER — Telehealth: Admitting: Internal Medicine

## 2024-06-21 ENCOUNTER — Encounter: Payer: Self-pay | Admitting: Internal Medicine

## 2024-06-21 DIAGNOSIS — Z7984 Long term (current) use of oral hypoglycemic drugs: Secondary | ICD-10-CM

## 2024-06-21 DIAGNOSIS — E118 Type 2 diabetes mellitus with unspecified complications: Secondary | ICD-10-CM | POA: Diagnosis not present

## 2024-06-21 DIAGNOSIS — Z79899 Other long term (current) drug therapy: Secondary | ICD-10-CM | POA: Diagnosis not present

## 2024-06-21 DIAGNOSIS — I10 Essential (primary) hypertension: Secondary | ICD-10-CM | POA: Diagnosis not present

## 2024-06-21 DIAGNOSIS — E559 Vitamin D deficiency, unspecified: Secondary | ICD-10-CM

## 2024-06-21 NOTE — Progress Notes (Signed)
 Virtual Visit via Video Note  I connected with Susan Peters on 06/21/24 at  4:00 PM EDT by a video enabled telemedicine application and verified that I am speaking with the correct person using two identifiers. Location patient: vehicle  Location provider:work office Persons participating in the virtual visit: patient, provider   Patient aware  of the limitations of evaluation and management by telemedicine and  availability of in person appointments. and agreed to proceed.   HPI: Susan Peters presents for video visit  for fu of  lab that now shows  diabetic range  Low vit d    began simple yoogurt with vit da and milk  fortified  Has stopped pepsi and sweet tea   only unsweetened  Increase water intake daily and  HT on valsartan  doing well  Lipids crestor   Prefers no meds at this time  has slow  weight down to 236 range  closwe to 10 #  ROS: See pertinent positives and negatives per HPI.  Past Medical History:  Diagnosis Date   Anemia    Arthritis    Back pain    Hx of bacterial pneumonia    Migraine    long standing 3-4 x per month takes med    Past Surgical History:  Procedure Laterality Date   ABDOMINAL HYSTERECTOMY  01/2015   KNEE SURGERY Left    1982   KNEE SURGERY Left 2019   KNEE SURGERY Right 2021   SHOULDER SURGERY Left    1981    Family History  Problem Relation Age of Onset   Arthritis Mother    Cancer Mother        Uterus   Hyperlipidemia Mother    Stroke Mother    Heart disease Mother    Hypertension Mother    Diabetes Mother    Hyperlipidemia Father    Heart disease Father    Hypertension Father    Early death Father    Rheum arthritis Sister    Arthritis Maternal Grandmother    Stroke Maternal Grandmother    Hypertension Maternal Grandmother    Arthritis Maternal Grandfather    Heart disease Maternal Grandfather    Hypertension Maternal Grandfather    Arthritis Paternal Grandmother    Heart disease Paternal Grandmother    Hypertension  Paternal Grandmother    Diabetes Paternal Grandmother    Arthritis Paternal Grandfather    Heart disease Paternal Grandfather    Stroke Paternal Grandfather    Hypertension Paternal Grandfather    Kidney disease Paternal Grandfather    Cancer Maternal Aunt    Hypertension Maternal Aunt    Diabetes Maternal Aunt    Arthritis/Rheumatoid Maternal Aunt    Cancer Maternal Aunt        Lung   Diabetes Maternal Aunt    Heart disease Paternal Uncle    Diabetes Cousin     Social History   Tobacco Use   Smoking status: Every Day    Current packs/day: 0.50    Types: Cigarettes   Smokeless tobacco: Never  Vaping Use   Vaping status: Never Used  Substance Use Topics   Alcohol use: Yes    Alcohol/week: 0.0 standard drinks of alcohol    Comment: social   Drug use: No      Current Outpatient Medications:    acetaminophen  (TYLENOL ) 500 MG tablet, Take 500 mg by mouth every 12 (twelve) hours as needed., Disp: , Rfl:    diclofenac Sodium (VOLTAREN) 1 % GEL, Apply  2 g topically in the morning and at bedtime., Disp: , Rfl:    rosuvastatin  (CRESTOR ) 5 MG tablet, Take 1 tablet (5 mg total) by mouth daily., Disp: 90 tablet, Rfl: 0   valsartan  (DIOVAN ) 160 MG tablet, Take 1 tablet (160 mg total) by mouth daily., Disp: 90 tablet, Rfl: 3   mupirocin  ointment (BACTROBAN ) 2 %, Apply 1 Application topically 2 (two) times daily. (Patient not taking: Reported on 06/21/2024), Disp: 30 g, Rfl: 0  EXAM: BP Readings from Last 3 Encounters:  05/18/24 130/78  12/01/23 120/70  11/24/23 120/80   Wt Readings from Last 3 Encounters:  05/18/24 243 lb 12.8 oz (110.6 kg)  12/01/23 238 lb 4 oz (108.1 kg)  11/24/23 235 lb 6 oz (106.8 kg)    VITALS per patient if applicable:  GENERAL: alert, oriented, appears well and in no acute distress  HEENT: atraumatic, conjunttiva clear, no obvious abnormalities on inspection of external nose and ears  NECK: normal movements of the head and neck  LUNGS: on  inspection no signs of respiratory distress, breathing rate appears normal, no obvious gross SOB, gasping or wheezing  CV: no obvious cyanosis  MS: moves all visible extremities without noticeable abnormality  PSYCH/NEURO: pleasant and cooperative, no obvious depression or anxiety, speech and thought processing grossly intact Lab Results  Component Value Date   WBC 14.9 (H) 05/18/2024   HGB 12.9 05/18/2024   HCT 39.7 05/18/2024   PLT 282.0 05/18/2024   GLUCOSE 89 05/18/2024   CHOL 154 05/18/2024   TRIG 143.0 05/18/2024   HDL 35.70 (L) 05/18/2024   LDLCALC 89 05/18/2024   ALT 16 05/18/2024   AST 15 05/18/2024   NA 140 05/18/2024   K 3.9 05/18/2024   CL 106 05/18/2024   CREATININE 0.69 05/18/2024   BUN 14 05/18/2024   CO2 27 05/18/2024   TSH 3.65 05/18/2024   HGBA1C 6.9 (H) 05/18/2024   MICROALBUR <0.7 05/18/2024    ASSESSMENT AND PLAN:  Discussed the following assessment and plan:    ICD-10-CM   1. Controlled type 2 diabetes mellitus with complication, without long-term current use of insulin (HCC)  E11.8    new onset  6.9 a1c    2. Medication management  Z79.899     3. Essential hypertension  I10     4. Vitamin D  deficiency  E55.9      Usu add metformin  first  then poss  glp1 or sglt2  reviewed  Shared Decision Making  But she has taken many ls changes that may control current metabolic condition  Counsel . Ok to cont with Intensify lifestyle interventions.  For now  Keep sept   appt and we can fu A1c at that time  in addition to vit d checking  and any other advised fu  Counseled.   Expectant management and discussion of plan and treatment with opportunity to ask questions and all were answered. The patient agreed with the plan and demonstrated an understanding of the instructions.   Advised to call back or seek an in-person evaluation if worsening  or having  further concerns  in interim. Return for when planned unless  needed in iinterim .    Apolinar Eastern, MD

## 2024-07-21 ENCOUNTER — Encounter: Payer: Self-pay | Admitting: Family Medicine

## 2024-07-21 ENCOUNTER — Ambulatory Visit: Admitting: Family Medicine

## 2024-07-21 VITALS — BP 120/80 | HR 88 | Temp 97.8°F | Resp 16 | Ht 62.8 in | Wt 236.0 lb

## 2024-07-21 DIAGNOSIS — J040 Acute laryngitis: Secondary | ICD-10-CM

## 2024-07-21 DIAGNOSIS — J069 Acute upper respiratory infection, unspecified: Secondary | ICD-10-CM

## 2024-07-21 LAB — POC COVID19 BINAXNOW: SARS Coronavirus 2 Ag: NEGATIVE

## 2024-07-21 NOTE — Patient Instructions (Addendum)
 A few things to remember from today's visit:  URI, acute  Laryngitis  Monitor for new symptoms. Voice rest. Treatment for acid reflux and smoking cessation will also help.  Do not use My Chart to request refills or for acute issues that need immediate attention. If you send a my chart message, it may take a few days to be addressed, specially if I am not in the office.  Please be sure medication list is accurate. If a new problem present, please set up appointment sooner than planned today.

## 2024-07-21 NOTE — Progress Notes (Signed)
 ACUTE VISIT Chief Complaint  Patient presents with   Laryngitis    Losing voice, started yesterday. Has a speaking engagement on Sunday    HPI: Susan Peters is a 58 y.o. female, who is here today complaining of voice changes. She endorses sore throat yesterday and noticed she has been losing her voice.  Her voice changes have persisted, although her sore throat has resolved.  Denies any pain when speaking.  Sore Throat  This is a new problem. The current episode started yesterday. The problem has been gradually improving. There has been no fever. The pain is mild. Associated symptoms include coughing (No more than usual) and a hoarse voice. Pertinent negatives include no abdominal pain, congestion, diarrhea, drooling, ear discharge, ear pain, headaches, plugged ear sensation, neck pain, shortness of breath, stridor, swollen glands, trouble swallowing or vomiting. She has had no exposure to strep or mono. She has tried acetaminophen  for the symptoms. The treatment provided moderate relief.   She took a Tylenol  yesterday and gargled salt-water this morning. At home Covid test done yesterday; results were negative.  Has been monitoring her temperature at home.  She endorses heartburn and fatigue at baseline; no recent changes. Negative for fever, chills, body aches, dysphagia, nausea, wheezing, nasal congestion, sinus congestion, or dyspnea.   Of note, pt has a speaking engagement on Sunday and is concerned her sx may persist.  Review of Systems  Constitutional:  Negative for activity change, appetite change and chills.  HENT:  Positive for hoarse voice. Negative for congestion, drooling, ear discharge, ear pain, mouth sores, postnasal drip, sinus pain and trouble swallowing.   Respiratory:  Positive for cough (No more than usual). Negative for shortness of breath, wheezing and stridor.   Cardiovascular:  Negative for chest pain.  Gastrointestinal:  Negative for abdominal pain, diarrhea  and vomiting.  Genitourinary:  Negative for decreased urine volume and hematuria.  Musculoskeletal:  Negative for myalgias and neck pain.  Neurological:  Negative for headaches.  See other pertinent positives and negatives in HPI.  Current Outpatient Medications on File Prior to Visit  Medication Sig Dispense Refill   acetaminophen  (TYLENOL ) 500 MG tablet Take 500 mg by mouth every 12 (twelve) hours as needed.     diclofenac Sodium (VOLTAREN) 1 % GEL Apply 2 g topically in the morning and at bedtime.     rosuvastatin  (CRESTOR ) 5 MG tablet Take 1 tablet (5 mg total) by mouth daily. 90 tablet 0   valsartan  (DIOVAN ) 160 MG tablet Take 1 tablet (160 mg total) by mouth daily. 90 tablet 3   mupirocin  ointment (BACTROBAN ) 2 % Apply 1 Application topically 2 (two) times daily. (Patient not taking: Reported on 07/21/2024) 30 g 0   No current facility-administered medications on file prior to visit.   Past Medical History:  Diagnosis Date   Anemia    Arthritis    Back pain    Hx of bacterial pneumonia    Migraine    long standing 3-4 x per month takes med   No Known Allergies  Social History   Socioeconomic History   Marital status: Single    Spouse name: Not on file   Number of children: Not on file   Years of education: Not on file   Highest education level: Bachelor's degree (e.g., BA, AB, BS)  Occupational History   Not on file  Tobacco Use   Smoking status: Every Day    Current packs/day: 0.50    Types: Cigarettes  Smokeless tobacco: Never  Vaping Use   Vaping status: Never Used  Substance and Sexual Activity   Alcohol use: Yes    Alcohol/week: 0.0 standard drinks of alcohol    Comment: social   Drug use: No   Sexual activity: Not on file  Other Topics Concern   Not on file  Social History Narrative   7-8 hours of sleep per night   Lives with her roommate   Works full time as a Education officer, community one step further    Arboriculturist   2  Cats in the home roomates    G1P1  Lives with father and visits    Net fa pos tob etoh    [place] [employer]  [place]    Social Drivers of Health   Financial Resource Strain: Low Risk  (10/27/2022)   Overall Financial Resource Strain (CARDIA)    Difficulty of Paying Living Expenses: Not very hard  Food Insecurity: No Food Insecurity (10/27/2022)   Hunger Vital Sign    Worried About Running Out of Food in the Last Year: Never true    Ran Out of Food in the Last Year: Never true  Transportation Needs: No Transportation Needs (10/27/2022)   PRAPARE - Administrator, Civil Service (Medical): No    Lack of Transportation (Non-Medical): No  Physical Activity: Insufficiently Active (10/27/2022)   Exercise Vital Sign    Days of Exercise per Week: 1 day    Minutes of Exercise per Session: 30 min  Stress: No Stress Concern Present (10/27/2022)   [employer] of Occupational Health - Occupational Stress Questionnaire    Feeling of Stress : Only a little  Social Connections: Socially Integrated (10/27/2022)   Social Connection and Isolation Panel    Frequency of Communication with Friends and Family: More than three times a week    Frequency of Social Gatherings with Friends and Family: Once a week    Attends Religious Services: More than 4 times per year    Active Member of [employer] or Organizations: Yes    Attends Banker Meetings: More than 4 times per year    Marital Status: Living with partner    Vitals:   07/21/24 1248  BP: 120/80  Pulse: 88  Resp: 16  Temp: 97.8 F (36.6 C)  SpO2: 96%   Body mass index is 42.07 kg/m.  Physical Exam Vitals and nursing note reviewed.  Constitutional:      General: She is not in acute distress.    Appearance: She is well-developed. She is not ill-appearing.  HENT:     Head: Normocephalic and atraumatic.     Mouth/Throat:     Mouth: Mucous membranes are moist.     Pharynx: Posterior oropharyngeal erythema  present. No oropharyngeal exudate or uvula swelling.     Comments: Minimal dysphonia. Eyes:     Conjunctiva/sclera: Conjunctivae normal.  Cardiovascular:     Rate and Rhythm: Normal rate and regular rhythm.     Heart sounds: No murmur heard. Pulmonary:     Effort: Pulmonary effort is normal. No respiratory distress.     Breath sounds: Normal breath sounds. No stridor.  Musculoskeletal:     Cervical back: No edema or erythema. No muscular tenderness.  Lymphadenopathy:     Head:     Right side of head: No submandibular adenopathy.     Left side of head: No submandibular adenopathy.     Cervical:  No cervical adenopathy.  Skin:    General: Skin is warm.     Findings: No erythema or rash.  Neurological:     Mental Status: She is alert and oriented to person, place, and time.  Psychiatric:        Mood and Affect: Mood and affect normal.    ASSESSMENT AND PLAN: Ms. MARIYA MOTTLEY was seen today for voice changes.  URI, acute -     POC COVID-19 BinaxNow  Laryngitis -     POC COVID-19 BinaxNow  Sore throat has greatly improved, mild dysphonia. We discussed possible etiologies, most likely viral but GERD and tobacco use can be contributing factors. She is not having more heartburn than usual, so for now she would like to hold on PPI.  COVID-19 here in the office negative. Monitor for new symptoms. Symptomatic treatment recommended for now. Voice rest. Instructed about warning signs.  Return if symptoms worsen or fail to improve.  I, Vernell Forest, acting as a scribe for Anarely Nicholls [place], MD., have documented all relevant documentation on the behalf of Quantavious Eggert [place], MD, as directed by   while in the presence of Kline Bulthuis [place], MD.  I, Yehya Brendle [place], MD, have reviewed all documentation for this visit. The documentation on 07/21/24 for the exam, diagnosis, procedures, and orders are all accurate and complete.   Yi Falletta G. [place], MD  [facility]. [place] office.

## 2024-08-03 ENCOUNTER — Other Ambulatory Visit: Payer: Self-pay | Admitting: Internal Medicine

## 2024-08-14 DIAGNOSIS — M79642 Pain in left hand: Secondary | ICD-10-CM | POA: Diagnosis not present

## 2024-08-29 DIAGNOSIS — M79645 Pain in left finger(s): Secondary | ICD-10-CM | POA: Diagnosis not present

## 2024-09-19 ENCOUNTER — Encounter: Payer: Self-pay | Admitting: Internal Medicine

## 2024-09-19 ENCOUNTER — Ambulatory Visit: Admitting: Internal Medicine

## 2024-09-19 VITALS — BP 118/74 | HR 78 | Temp 98.3°F | Ht 62.8 in | Wt 227.8 lb

## 2024-09-19 DIAGNOSIS — E785 Hyperlipidemia, unspecified: Secondary | ICD-10-CM

## 2024-09-19 DIAGNOSIS — Z79899 Other long term (current) drug therapy: Secondary | ICD-10-CM | POA: Diagnosis not present

## 2024-09-19 DIAGNOSIS — I10 Essential (primary) hypertension: Secondary | ICD-10-CM

## 2024-09-19 DIAGNOSIS — E118 Type 2 diabetes mellitus with unspecified complications: Secondary | ICD-10-CM | POA: Diagnosis not present

## 2024-09-19 DIAGNOSIS — Z23 Encounter for immunization: Secondary | ICD-10-CM

## 2024-09-19 DIAGNOSIS — F17200 Nicotine dependence, unspecified, uncomplicated: Secondary | ICD-10-CM

## 2024-09-19 DIAGNOSIS — E7841 Elevated Lipoprotein(a): Secondary | ICD-10-CM

## 2024-09-19 LAB — POCT GLYCOSYLATED HEMOGLOBIN (HGB A1C): Hemoglobin A1C: 6.2 % — AB (ref 4.0–5.6)

## 2024-09-19 NOTE — Progress Notes (Signed)
 Chief Complaint  Patient presents with   Medical Management of Chronic Issues    HPI: Susan Peters 58 y.o. come in for Chronic disease management   New onset dm :still stopped pepsi  more water  m ore activity  lsi  partner on ozempic has helped the house hol  HT'  no chang meds Tobacco :  still working on stopping  Hld: crestor    has elevated lipo A  Sinc elast visit  hand thumb eva trigger thumb and had CS shot  L thumb  Uri laryngitis . Flu vaccine today   ROS: See pertinent positives and negatives per HPI.  Past Medical History:  Diagnosis Date   Anemia    Arthritis    Back pain    Hx of bacterial pneumonia    Migraine    long standing 3-4 x per month takes med    Family History  Problem Relation Age of Onset   Arthritis Mother    Cancer Mother        Uterus   Hyperlipidemia Mother    Stroke Mother    Heart disease Mother    Hypertension Mother    Diabetes Mother    Hyperlipidemia Father    Heart disease Father    Hypertension Father    Early death Father    Rheum arthritis Sister    Arthritis Maternal Grandmother    Stroke Maternal Grandmother    Hypertension Maternal Grandmother    Arthritis Maternal Grandfather    Heart disease Maternal Grandfather    Hypertension Maternal Grandfather    Arthritis Paternal Grandmother    Heart disease Paternal Grandmother    Hypertension Paternal Grandmother    Diabetes Paternal Grandmother    Arthritis Paternal Grandfather    Heart disease Paternal Grandfather    Stroke Paternal Grandfather    Hypertension Paternal Grandfather    Kidney disease Paternal Grandfather    Cancer Maternal Aunt    Hypertension Maternal Aunt    Diabetes Maternal Aunt    Arthritis/Rheumatoid Maternal Aunt    Cancer Maternal Aunt        Lung   Diabetes Maternal Aunt    Heart disease Paternal Uncle    Diabetes Cousin     Social History   Socioeconomic History   Marital status: Single    Spouse name: Not on file   Number of  children: Not on file   Years of education: Not on file   Highest education level: Bachelor's degree (e.g., BA, AB, BS)  Occupational History   Not on file  Tobacco Use   Smoking status: Every Day    Current packs/day: 0.50    Types: Cigarettes   Smokeless tobacco: Never  Vaping Use   Vaping status: Never Used  Substance and Sexual Activity   Alcohol use: Yes    Alcohol/week: 0.0 standard drinks of alcohol    Comment: social   Drug use: No   Sexual activity: Not on file  Other Topics Concern   Not on file  Social History Narrative   7-8 hours of sleep per night   Lives with her roommate   Works full time as a Education officer, 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  1986    Social  Drivers of Corporate investment banker Strain: Low Risk  (10/27/2022)   Overall Financial Resource Strain (CARDIA)    Difficulty of Paying Living Expenses: Not very hard  Food Insecurity: No Food Insecurity (10/27/2022)   Hunger Vital Sign    Worried About Running Out of Food in the Last Year: Never true    Ran Out of Food in the Last Year: Never true  Transportation Needs: No Transportation Needs (10/27/2022)   PRAPARE - Administrator, Civil Service (Medical): No    Lack of Transportation (Non-Medical): No  Physical Activity: Insufficiently Active (10/27/2022)   Exercise Vital Sign    Days of Exercise per Week: 1 day    Minutes of Exercise per Session: 30 min  Stress: No Stress Concern Present (10/27/2022)   [employer] of Occupational Health - Occupational Stress Questionnaire    Feeling of Stress : Only a little  Social Connections: Socially Integrated (10/27/2022)   Social Connection and Isolation Panel    Frequency of Communication with Friends and Family: More than three times a week    Frequency of Social Gatherings with  Friends and Family: Once a week    Attends Religious Services: More than 4 times per year    Active Member of [employer] or Organizations: Yes    Attends Engineer, structural: More than 4 times per year    Marital Status: Living with partner    Outpatient Medications Prior to Visit  Medication Sig Dispense Refill   acetaminophen  (TYLENOL ) 500 MG tablet Take 500 mg by mouth every 12 (twelve) hours as needed.     Cholecalciferol (VITAMIN D3) 1000 units CAPS Take by mouth daily.     diclofenac Sodium (VOLTAREN) 1 % GEL Apply 2 g topically in the morning and at bedtime.     rosuvastatin  (CRESTOR ) 5 MG tablet TAKE 1 TABLET (5 MG TOTAL) BY MOUTH DAILY. 90 tablet 0   valsartan  (DIOVAN ) 160 MG tablet Take 1 tablet (160 mg total) by mouth daily. 90 tablet 3   mupirocin  ointment (BACTROBAN ) 2 % Apply 1 Application topically 2 (two) times daily. (Patient not taking: Reported on 09/19/2024) 30 g 0   No facility-administered medications prior to visit.     EXAM:  BP 118/74 (BP Location: Left Arm, Patient Position: Sitting, Cuff Size: Large)   Pulse 78   Temp 98.3 F (36.8 C) (Oral)   Ht 5' 2.8 (1.595 m)   Wt 227 lb 12.8 oz (103.3 kg)   LMP 11/25/2014   SpO2 98%   BMI 40.61 kg/m   Body mass index is 40.61 kg/m.  GENERAL: vitals reviewed and listed above, alert, oriented, appears well hydrated and in no acute distress HEENT: atraumatic, conjunctiva  clear, no obvious abnormalities on inspection of external nose and ears NECK: no obvious masses on inspection palpation  LUNGS: clear to auscultation bilaterally, no wheezes, rales or rhonchi, good air movement CV: HRRR, no clubbing cyanosis or  peripheral edema nl cap refill  MS: moves all extremities without noticeable focal  abnormality PSYCH: pleasant and cooperative, no obvious depression or anxiety Lab Results  Component Value Date   WBC 14.9 (H) 05/18/2024   HGB 12.9 05/18/2024   HCT 39.7 05/18/2024   PLT 282.0 05/18/2024    GLUCOSE 89 05/18/2024   CHOL 154 05/18/2024   TRIG 143.0 05/18/2024   HDL 35.70 (L) 05/18/2024   LDLCALC 89 05/18/2024   ALT 16 05/18/2024   AST 15 05/18/2024   NA  140 05/18/2024   K 3.9 05/18/2024   CL 106 05/18/2024   CREATININE 0.69 05/18/2024   BUN 14 05/18/2024   CO2 27 05/18/2024   TSH 3.65 05/18/2024   HGBA1C 6.2 (A) 09/19/2024   MICROALBUR <0.7 05/18/2024   BP Readings from Last 3 Encounters:  09/19/24 118/74  07/21/24 120/80  05/18/24 130/78    ASSESSMENT AND PLAN:  Discussed the following assessment and plan:  Controlled type 2 diabetes mellitus with complication, without long-term current use of insulin (HCC) - Plan: POC HgB A1c  Essential hypertension  Tobacco use disorder  Influenza vaccine needed - Plan: Flu vaccine trivalent PF, 6mos and older(Flulaval,Afluria,Fluarix,Fluzone)  Medication management  Hyperlipidemia, unspecified hyperlipidemia type  Elevated lipoprotein(a) Continue same meds  Lsi to control DM a 1 c 6.2   Disc tobacco cessation  Plan  6 mos  FU  -Patient advised to return or notify health care team  if  new concerns arise. 32 minutes  review disc meds lsi tobacco cessation  and plan fu fu lipo A is currently not a known modifiable risk factor so  optimize the others  Patient Instructions  Good to see you   A1c is in range good now 6.2  Continue to work on tobacco  cessation.  As risk factor for  vascular disease.     Sharisa Toves K. [place] Krist M.D.

## 2024-09-19 NOTE — Patient Instructions (Addendum)
 Good to see you   A1c is in range good now 6.2  Continue to work on tobacco  cessation.  As risk factor for  vascular disease.

## 2024-11-04 ENCOUNTER — Other Ambulatory Visit: Payer: Self-pay | Admitting: Internal Medicine

## 2025-01-29 ENCOUNTER — Telehealth: Admitting: Nurse Practitioner

## 2025-01-29 DIAGNOSIS — J40 Bronchitis, not specified as acute or chronic: Secondary | ICD-10-CM

## 2025-01-29 DIAGNOSIS — J329 Chronic sinusitis, unspecified: Secondary | ICD-10-CM

## 2025-01-29 DIAGNOSIS — R058 Other specified cough: Secondary | ICD-10-CM

## 2025-01-29 MED ORDER — PROMETHAZINE-DM 6.25-15 MG/5ML PO SYRP: 5.0000 mL | ORAL_SOLUTION | Freq: Four times a day (QID) | ORAL | 0 refills | Status: AC | PRN

## 2025-01-29 MED ORDER — AMOXICILLIN-POT CLAVULANATE 875-125 MG PO TABS: 1.0000 | ORAL_TABLET | Freq: Two times a day (BID) | ORAL | 0 refills | Status: AC

## 2025-01-29 MED ORDER — FLUTICASONE PROPIONATE 50 MCG/ACT NA SUSP: 2.0000 | Freq: Every day | NASAL | 0 refills | Status: AC

## 2025-03-20 ENCOUNTER — Ambulatory Visit: Admitting: Internal Medicine
